# Patient Record
Sex: Male | Born: 1945 | Race: White | Hispanic: No | Marital: Married | Smoking: Former smoker
Health system: Southern US, Community
[De-identification: ages and names within clinical notes are randomized; demographics above are authoritative.]

## PROBLEM LIST (undated history)

## (undated) DIAGNOSIS — R0600 Dyspnea, unspecified: Secondary | ICD-10-CM

## (undated) DIAGNOSIS — Z89029 Acquired absence of unspecified finger(s): Secondary | ICD-10-CM

## (undated) DIAGNOSIS — J439 Emphysema, unspecified: Secondary | ICD-10-CM

## (undated) DIAGNOSIS — I714 Abdominal aortic aneurysm, without rupture, unspecified: Secondary | ICD-10-CM

## (undated) DIAGNOSIS — R0609 Other forms of dyspnea: Secondary | ICD-10-CM

## (undated) DIAGNOSIS — I10 Essential (primary) hypertension: Secondary | ICD-10-CM

## (undated) DIAGNOSIS — C61 Malignant neoplasm of prostate: Secondary | ICD-10-CM

## (undated) DIAGNOSIS — Z972 Presence of dental prosthetic device (complete) (partial): Secondary | ICD-10-CM

## (undated) DIAGNOSIS — Z973 Presence of spectacles and contact lenses: Secondary | ICD-10-CM

## (undated) DIAGNOSIS — M1990 Unspecified osteoarthritis, unspecified site: Secondary | ICD-10-CM

## (undated) HISTORY — PX: PROSTATE BIOPSY: SHX241

## (undated) HISTORY — PX: TONSILLECTOMY: SUR1361

## (undated) HISTORY — PX: APPENDECTOMY: SHX54

---

## 1990-01-14 HISTORY — PX: LAPAROSCOPIC CHOLECYSTECTOMY: SUR755

## 2005-04-08 ENCOUNTER — Encounter: Admission: RE | Admit: 2005-04-08 | Discharge: 2005-04-08 | Payer: Self-pay | Admitting: Occupational Medicine

## 2005-04-08 ENCOUNTER — Ambulatory Visit (HOSPITAL_COMMUNITY): Admission: RE | Admit: 2005-04-08 | Discharge: 2005-04-08 | Payer: Self-pay | Admitting: Occupational Medicine

## 2014-01-14 ENCOUNTER — Emergency Department (HOSPITAL_COMMUNITY)
Admission: EM | Admit: 2014-01-14 | Discharge: 2014-01-14 | Disposition: A | Discharge status: Home / Self Care | Payer: Medicare Other | Attending: Emergency Medicine | Admitting: Emergency Medicine

## 2014-01-14 ENCOUNTER — Encounter (HOSPITAL_COMMUNITY): Payer: Self-pay

## 2014-01-14 ENCOUNTER — Emergency Department (HOSPITAL_COMMUNITY): Payer: Medicare Other

## 2014-01-14 DIAGNOSIS — Z79899 Other long term (current) drug therapy: Secondary | ICD-10-CM | POA: Insufficient documentation

## 2014-01-14 DIAGNOSIS — Z7952 Long term (current) use of systemic steroids: Secondary | ICD-10-CM | POA: Diagnosis not present

## 2014-01-14 DIAGNOSIS — J387 Other diseases of larynx: Secondary | ICD-10-CM | POA: Insufficient documentation

## 2014-01-14 DIAGNOSIS — R221 Localized swelling, mass and lump, neck: Secondary | ICD-10-CM | POA: Insufficient documentation

## 2014-01-14 DIAGNOSIS — K088 Other specified disorders of teeth and supporting structures: Secondary | ICD-10-CM | POA: Diagnosis present

## 2014-01-14 DIAGNOSIS — J449 Chronic obstructive pulmonary disease, unspecified: Secondary | ICD-10-CM | POA: Insufficient documentation

## 2014-01-14 LAB — I-STAT CHEM 8, ED
BUN: 18 mg/dL (ref 6–23)
CHLORIDE: 102 meq/L (ref 96–112)
CREATININE: 0.9 mg/dL (ref 0.50–1.35)
Calcium, Ion: 1.18 mmol/L (ref 1.13–1.30)
GLUCOSE: 114 mg/dL — AB (ref 70–99)
HCT: 56 % — ABNORMAL HIGH (ref 39.0–52.0)
Hemoglobin: 19 g/dL — ABNORMAL HIGH (ref 13.0–17.0)
POTASSIUM: 4.6 mmol/L (ref 3.5–5.1)
Sodium: 139 mmol/L (ref 135–145)
TCO2: 25 mmol/L (ref 0–100)

## 2014-01-14 LAB — RAPID STREP SCREEN (MED CTR MEBANE ONLY): Streptococcus, Group A Screen (Direct): NEGATIVE

## 2014-01-14 MED ORDER — IOHEXOL 300 MG/ML  SOLN
80.0000 mL | Freq: Once | INTRAMUSCULAR | Status: AC | PRN
  Administered 2014-01-14: 80 mL via INTRAVENOUS

## 2014-01-14 MED ORDER — CLINDAMYCIN HCL 150 MG PO CAPS
300.0000 mg | ORAL_CAPSULE | Freq: Once | ORAL | Status: AC
  Administered 2014-01-14: 300 mg via ORAL
  Filled 2014-01-14: qty 2

## 2014-01-14 MED ORDER — HYDROCODONE-ACETAMINOPHEN 5-325 MG PO TABS: 1.0000 | ORAL_TABLET | ORAL | Status: DC | PRN

## 2014-01-14 MED ORDER — CLINDAMYCIN HCL 300 MG PO CAPS: 300.0000 mg | ORAL_CAPSULE | Freq: Four times a day (QID) | ORAL | Status: DC

## 2014-01-14 MED ORDER — ONDANSETRON HCL 4 MG/2ML IJ SOLN
4.0000 mg | Freq: Once | INTRAMUSCULAR | Status: AC
  Administered 2014-01-14: 4 mg via INTRAVENOUS
  Filled 2014-01-14: qty 2

## 2014-01-14 MED ORDER — KETOROLAC TROMETHAMINE 30 MG/ML IJ SOLN
30.0000 mg | Freq: Once | INTRAMUSCULAR | Status: AC
  Administered 2014-01-14: 30 mg via INTRAVENOUS
  Filled 2014-01-14: qty 1

## 2014-01-14 MED ORDER — MORPHINE SULFATE 4 MG/ML IJ SOLN
4.0000 mg | Freq: Once | INTRAMUSCULAR | Status: AC
  Administered 2014-01-14: 2 mg via INTRAVENOUS
  Filled 2014-01-14: qty 1

## 2014-01-14 NOTE — ED Notes (Signed)
Mark Cline, [place] has assessed pt; awaiting Dr. Vevelyn Francois assessment to determine if any imaging tests are necessary. Pt is aware of plan and verbalized understanding.

## 2014-01-14 NOTE — ED Provider Notes (Signed)
CSN: [id number]     Arrival date & time 01/14/14  1045 History   First MD Initiated Contact with Patient 01/14/14 1102     Chief Complaint  Patient presents with  . Dental Pain     (Consider location/radiation/quality/duration/timing/severity/associated sxs/prior Treatment) The history is provided by the patient.   Mark Cline is a 69 y.o. male with a history of COPD, HTN, continued smoker presenting with left sided jaw and throat pain which has been slowly progressive for the past 4 days.  He describes several pain with swallowing but is able to swallow, maintaining saliva and soft to liquid oral intake.  He denies fevers or chills and denies any dental problems although the pain does radiate to the left jawline.  He denies any worsened shortness of breath from his baseline, cough, wheezing, stridor or voice changes. He has had no recent uri sx including nasal congestion or rhinorrhea.   He has taken no medications prior to arrival today.     Past Medical History  Diagnosis Date  . COPD (chronic obstructive pulmonary disease)   . Emphysema lung    Past Surgical History  Procedure Laterality Date  . Cholecystectomy    . Appendectomy    . Finger amputated     No family history on file. History  Substance Use Topics  . Smoking status: Current Every Day Smoker  . Smokeless tobacco: Not on file  . Alcohol Use: No    Review of Systems  Constitutional: Negative for fever and chills.  HENT: Positive for sore throat. Negative for congestion, ear pain, rhinorrhea, sinus pressure, trouble swallowing and voice change.   Eyes: Negative for discharge.  Respiratory: Negative for cough, choking, shortness of breath, wheezing and stridor.   Cardiovascular: Negative for chest pain.  Gastrointestinal: Negative for abdominal pain.  Genitourinary: Negative.       Allergies  Review of patient's allergies indicates no known allergies.  Home Medications   Prior to Admission medications    Medication Sig Start Date End Date Taking? Authorizing Provider  Fluticasone-Salmeterol (ADVAIR) 250-50 MCG/DOSE AEPB Inhale 1 puff into the lungs 2 (two) times daily.   Yes Historical Provider, MD  lisinopril (PRINIVIL,ZESTRIL) 10 MG tablet Take 10 mg by mouth daily.   Yes Historical Provider, MD   BP 148/82 mmHg  Pulse 80  Temp(Src) 97.9 F (36.6 C) (Oral)  Resp 18  Ht 5\' 11"  (1.803 m)  Wt 200 lb (90.719 kg)  BMI 27.91 kg/m2  SpO2 99% Physical Exam  Constitutional: He is oriented to person, place, and time. He appears well-developed and well-nourished.  HENT:  Head: Normocephalic and atraumatic.  Right Ear: Tympanic membrane and ear canal normal.  Left Ear: Tympanic membrane and ear canal normal.  Nose: No mucosal edema or rhinorrhea.  Mouth/Throat: Uvula is midline and mucous membranes are normal. Posterior oropharyngeal erythema present. No oropharyngeal exudate, posterior oropharyngeal edema or tonsillar abscesses.  Trace erythema along soft palate edge only.  No edema, no exudate. Tonsils absent. Uvula midline, unable to visualize epiglottis.  Eyes: Conjunctivae are normal.  Neck: Neck supple. No tracheal tenderness present. No thyroid mass present.  Cardiovascular: Normal rate and normal heart sounds.   Pulmonary/Chest: Effort normal. No stridor. No respiratory distress. He has no decreased breath sounds. He has no wheezes. He has no rhonchi. He has no rales.  Abdominal: Soft. There is no tenderness.  Musculoskeletal: Normal range of motion.  Lymphadenopathy:       Head (left side): Tonsillar adenopathy  present.    He has no cervical adenopathy.  Mild left tonsillar enlargement compared to right, ttp.  Neurological: He is alert and oriented to person, place, and time.  Skin: Skin is warm and dry. No rash noted.  Psychiatric: He has a normal mood and affect.    ED Course  Procedures (including critical care time) Labs Review Labs Reviewed  I-STAT CHEM 8, ED -  Abnormal; Notable for the following:    Glucose, Bld 114 (*)    Hemoglobin 19.0 (*)    HCT 56.0 (*)    All other components within normal limits  RAPID STREP SCREEN  CULTURE, GROUP A STREP    Imaging Review Ct Soft Tissue Neck W Contrast  01/14/2014   CLINICAL DATA:  Painful palpable lump in the left side of the neck. Difficulty swallowing. Throat pain while eating. No recent injuries.  EXAM: CT NECK WITH CONTRAST  TECHNIQUE: Multidetector CT imaging of the neck was performed using the standard protocol following the bolus administration of intravenous contrast. The site of the palpable lump was marked with a metallic BB.  CONTRAST:  20mL OMNIPAQUE IOHEXOL 300 MG/ML IV.  COMPARISON:  None.  FINDINGS: In the area of palpable concern, there is no mass or pathologic lymphadenopathy. The inferior margin of the submandibular salivary gland on the left directly underlies the area of palpable concern.  Pharynx and larynx: 7 mm diameter cyst arising from the epiglottis with enhancing wall. Prominent though symmetric adenoidal tissue in both sides of the nasopharynx. No other mass involving the pharynx. Normal-appearing larynx.  Salivary glands: Normal.  Thyroid: Mildly atrophic, otherwise normal.  Lymph nodes: No pathologic cervical lymphadenopathy.  Vascular: Atherosclerosis involving the common carotid arteries, carotid bulbs and proximal internal carotid arteries bilaterally without significant stenosis.  Limited intracranial: Unremarkable.  Mastoids and visualized paranasal sinuses: Clear.  Skeleton: Moderate to severe space narrowing and endplate hypertrophic changes at C5-6 and C6-7, and to a lesser degree C3-4.  Upper chest: Severe emphysematous changes involving the visualized upper lobes.  IMPRESSION: 1. No mass or pathologic lymphadenopathy in the area of palpable concern in the left side of the neck. The inferior margin of the left submandibular salivary gland directly underlies this area. 2. 7 mm cyst  arising from the epiglottis. The fact that the cyst has an enhancing wall may indicate that it is an infected cyst and may be a cause for the patient's sore throat. 3. No masses elsewhere in the neck. No pathologic cervical lymphadenopathy. 4. Bilateral carotid atherosclerosis without evidence of significant stenosis. 5. Degenerative changes in the cervical spine is as detailed above. 6. Severe emphysematous changes in the visualized upper lobes.   Electronically Signed   By: Evangeline Dakin M.D.   On: 01/14/2014 13:56     EKG Interpretation None      MDM   Final diagnoses:  Neck swelling  Epiglottic cyst    Pt was seen by Dr Wyvonnia Dusky during this visit.  Ct soft tissue neck ordered.   Results per above.  Discussed with Dr Redmond Baseman who advised clindamycin and close f/u with him.  Will see in his office early this week. Pt given instructions for close f/u here or [place], calling Dr Redmond Baseman as well for worsened sx including fever, increased pain, swallowing or swelling.  Pt understands plan and need for recheck immediately for any worsened sx.  He was given first dose of clindamycin here.  Prescribed hydrocodone for pain relief, cautioned re sedation. Soft foods/fluids as  tolerated.    Evalee Jefferson, PA-C 01/14/14 1454  Ezequiel Essex, MD 01/14/14 [phone]

## 2014-01-14 NOTE — ED Notes (Signed)
Pt c/o toothache, left jaw pain, and sore throat on left side of neck since Tuesday.  Denies fever.

## 2014-01-14 NOTE — Discharge Instructions (Signed)
As discussed call Dr. Redmond Baseman or return here (or to [facility]) immediately for any worsening pain, swelling, shortness of breath or other new or worsened symptoms.  Take the antibiotics as prescribed, 2 more doses today.  You may take the hydrocodone prescribed for pain relief.  This will make you drowsy - do not drive within 4 hours of taking this medication.  Eat whatever is comfortable - you may want to maintain a soft food diet for the next several days.  Eating yogurt with active yeast cultures daily may help prevent side effects (diarrhea) from this antibiotic.

## 2014-01-16 LAB — CULTURE, GROUP A STREP

## 2017-02-07 ENCOUNTER — Ambulatory Visit: Payer: Medicare Other | Admitting: Urology

## 2017-02-07 ENCOUNTER — Other Ambulatory Visit (HOSPITAL_COMMUNITY)
Admission: AD | Admit: 2017-02-07 | Discharge: 2017-02-07 | Disposition: A | Discharge status: Home / Self Care | Payer: Medicare Other | Source: Other Acute Inpatient Hospital | Attending: Urology | Admitting: Urology

## 2017-02-07 ENCOUNTER — Other Ambulatory Visit: Payer: Self-pay | Admitting: Urology

## 2017-02-07 DIAGNOSIS — N402 Nodular prostate without lower urinary tract symptoms: Secondary | ICD-10-CM

## 2017-02-07 DIAGNOSIS — R3121 Asymptomatic microscopic hematuria: Secondary | ICD-10-CM | POA: Insufficient documentation

## 2017-02-07 DIAGNOSIS — R972 Elevated prostate specific antigen [PSA]: Secondary | ICD-10-CM

## 2017-02-07 LAB — URINALYSIS, COMPLETE (UACMP) WITH MICROSCOPIC
BACTERIA UA: NONE SEEN
BILIRUBIN URINE: NEGATIVE
Glucose, UA: NEGATIVE mg/dL
KETONES UR: NEGATIVE mg/dL
LEUKOCYTES UA: NEGATIVE
NITRITE: NEGATIVE
Protein, ur: NEGATIVE mg/dL
Specific Gravity, Urine: 1.016 (ref 1.005–1.030)
pH: 5 (ref 5.0–8.0)

## 2017-02-28 ENCOUNTER — Ambulatory Visit (HOSPITAL_COMMUNITY)
Admission: RE | Admit: 2017-02-28 | Discharge: 2017-02-28 | Disposition: A | Discharge status: Home / Self Care | Payer: Medicare Other | Source: Ambulatory Visit | Attending: Urology | Admitting: Urology

## 2017-02-28 ENCOUNTER — Encounter (HOSPITAL_COMMUNITY): Payer: Self-pay

## 2017-02-28 DIAGNOSIS — C61 Malignant neoplasm of prostate: Secondary | ICD-10-CM | POA: Insufficient documentation

## 2017-02-28 DIAGNOSIS — R972 Elevated prostate specific antigen [PSA]: Secondary | ICD-10-CM

## 2017-02-28 DIAGNOSIS — N402 Nodular prostate without lower urinary tract symptoms: Secondary | ICD-10-CM | POA: Diagnosis not present

## 2017-02-28 MED ORDER — LIDOCAINE HCL (PF) 2 % IJ SOLN
INTRAMUSCULAR | Status: AC
  Administered 2017-02-28: 10 mL
  Filled 2017-02-28: qty 10

## 2017-02-28 MED ORDER — CEFTRIAXONE SODIUM 1 G IJ SOLR
1.0000 g | Freq: Once | INTRAMUSCULAR | Status: AC
  Administered 2017-02-28: 1 g via INTRAMUSCULAR

## 2017-02-28 MED ORDER — CEFTRIAXONE SODIUM 1 G IJ SOLR
INTRAMUSCULAR | Status: AC
  Administered 2017-02-28: 1 g via INTRAMUSCULAR
  Filled 2017-02-28: qty 10

## 2017-02-28 MED ORDER — LIDOCAINE HCL (PF) 1 % IJ SOLN
INTRAMUSCULAR | Status: AC
  Administered 2017-02-28: 2.1 mL
  Filled 2017-02-28: qty 5

## 2017-02-28 NOTE — Discharge Instructions (Signed)
Transrectal Ultrasound-Guided Biopsy A transrectal ultrasound-guided biopsy is a procedure to remove samples of tissue from your prostate using ultrasound images to guide the procedure. The procedure is usually done to evaluate the prostate gland of men who have an elevated prostate-specific antigen (PSA). PSA is a blood test to screen for prostate cancer. The biopsy samples are taken to check for prostate cancer. Tell a health care provider about:  Any allergies you have.  All medicines you are taking, including vitamins, herbs, eye drops, creams, and over-the-counter medicines.  Any problems you or family members have had with anesthetic medicines.  Any blood disorders you have.  Any surgeries you have had.  Any medical conditions you have. What are the risks? Generally, this is a safe procedure. However, as with any procedure, problems can occur. Possible problems include:  Infection of your prostate.  Bleeding from your rectum or blood in your urine.  Difficulty urinating.  Nerve damage (this is usually temporary).  Damage to surrounding structures such as blood vessels, organs, and muscles, which would require other procedures.  What happens before the procedure?  Do not eat or drink anything after midnight on the night before the procedure or as directed by your health care provider.  Take medicines only as directed by your health care provider.  Your health care provider may have you stop taking certain medicines 5-7 days before the procedure.  You will be given an enema before the procedure. During an enema, a liquid is injected into your rectum to clear out waste.  You may have lab tests the day of your procedure.  Plan to have someone take you home after the procedure. What happens during the procedure?  You will be given medicine to help you relax (sedative) before the procedure. An IV tube will be inserted into one of your veins and used to give fluids and  medicine.  You will be given antibiotic medicine to reduce the risk of an infection.  You will be placed on your side for the procedure.  A probe with lubricated gel will be placed into your rectum, and images will be taken of your prostate and surrounding structures.  Numbing medicine will be injected into the prostate before the biopsy samples are taken.  A biopsy needle will then be inserted and guided to your prostate with the use of the ultrasound images.  Samples of prostate tissue will be taken, and the needle will then be removed.  The biopsy samples will be sent to a lab to be analyzed. Results are usually back in 2-3 days. What happens after the procedure?  You will be taken to a recovery area where you will be monitored.  You may have some discomfort in the rectal area. You will be given pain medicines to control this.  You may be allowed to go home the same day, or you may need to stay in the hospital overnight. This information is not intended to replace advice given to you by your health care provider. Make sure you discuss any questions you have with your health care provider. Document Released: 05/17/2013 Document Revised: 06/08/2015 Document Reviewed: 08/19/2012 Elsevier Interactive Patient Education  [employer].

## 2017-03-13 ENCOUNTER — Other Ambulatory Visit: Payer: Self-pay | Admitting: Urology

## 2017-03-13 DIAGNOSIS — C61 Malignant neoplasm of prostate: Secondary | ICD-10-CM

## 2017-03-17 ENCOUNTER — Ambulatory Visit (HOSPITAL_COMMUNITY)
Admission: RE | Admit: 2017-03-17 | Discharge: 2017-03-17 | Disposition: A | Discharge status: Home / Self Care | Payer: Medicare Other | Source: Ambulatory Visit | Attending: Urology | Admitting: Urology

## 2017-03-17 ENCOUNTER — Ambulatory Visit (HOSPITAL_COMMUNITY): Payer: Medicare Other

## 2017-03-17 ENCOUNTER — Ambulatory Visit (HOSPITAL_COMMUNITY): Admission: RE | Admit: 2017-03-17 | Payer: Medicare Other | Source: Ambulatory Visit

## 2017-03-17 ENCOUNTER — Encounter (HOSPITAL_COMMUNITY): Payer: Self-pay

## 2017-03-17 DIAGNOSIS — C61 Malignant neoplasm of prostate: Secondary | ICD-10-CM | POA: Diagnosis not present

## 2017-03-17 DIAGNOSIS — I714 Abdominal aortic aneurysm, without rupture: Secondary | ICD-10-CM | POA: Insufficient documentation

## 2017-03-17 DIAGNOSIS — K5730 Diverticulosis of large intestine without perforation or abscess without bleeding: Secondary | ICD-10-CM | POA: Diagnosis not present

## 2017-03-17 LAB — POCT I-STAT CREATININE: Creatinine, Ser: 1.2 mg/dL (ref 0.61–1.24)

## 2017-03-17 MED ORDER — IOPAMIDOL (ISOVUE-300) INJECTION 61%
100.0000 mL | Freq: Once | INTRAVENOUS | Status: AC | PRN
  Administered 2017-03-17: 100 mL via INTRAVENOUS

## 2017-03-18 ENCOUNTER — Encounter (HOSPITAL_COMMUNITY)
Admission: RE | Admit: 2017-03-18 | Discharge: 2017-03-18 | Disposition: A | Discharge status: Home / Self Care | Payer: Medicare Other | Source: Ambulatory Visit | Attending: Urology | Admitting: Urology

## 2017-03-18 ENCOUNTER — Encounter (HOSPITAL_COMMUNITY): Payer: Self-pay

## 2017-03-18 DIAGNOSIS — C61 Malignant neoplasm of prostate: Secondary | ICD-10-CM | POA: Diagnosis not present

## 2017-03-18 MED ORDER — TECHNETIUM TC 99M MEDRONATE IV KIT
25.0000 | PACK | Freq: Once | INTRAVENOUS | Status: AC | PRN
  Administered 2017-03-18: 20.5 via INTRAVENOUS

## 2017-03-31 ENCOUNTER — Telehealth: Payer: Self-pay | Admitting: Medical Oncology

## 2017-03-31 NOTE — Telephone Encounter (Signed)
I called pt to introduce myself as the Prostate Nurse Navigator and the Coordinator of the Prostate [place].  1. I confirmed with the patient he is aware of his referral to the clinic 04/01/17 arriving at 12:30pm.   2. I discussed the format of the clinic and the physicians he will be seeing that day.  3. I discussed where the clinic is located and how to contact me.  4. I confirmed his address and informed him I would be mailing a packet of information and forms to be completed. I asked him to bring them with him the day of his appointment.   He voiced understanding of the above. I asked him to call me if he has any questions or concerns regarding his appointments or the forms he needs to complete.

## 2017-04-01 ENCOUNTER — Encounter: Payer: Self-pay | Admitting: Medical Oncology

## 2017-04-03 ENCOUNTER — Encounter: Payer: Self-pay | Admitting: Radiation Oncology

## 2017-04-03 NOTE — Progress Notes (Signed)
GU Location of Tumor / Histology: prostatic adenocarcinoma  If Prostate Cancer, Gleason Score is (4 + 5) and PSA is (8.8). Prostate volume: 35 grams.  Mark Cline was referred by Dr. Monico Blitz, MD to Dr. Jeffie Pollock for further evaluation of an elevated PSA in January 2019  Biopsies of prostate (if applicable) revealed:    Past/Anticipated interventions by urology, if any: prostate biopsy, referral to [facility]  Past/Anticipated interventions by medical oncology, if any: no  Weight changes, if any: no  Bowel/Bladder complaints, if any: Reports leakage of urine, hard to postpone urination, and erection dysfunction. History of microhematuria.  Nausea/Vomiting, if any: no  Pain issues, if any:  no  SAFETY ISSUES:  Prior radiation? no  Pacemaker/ICD? no  Possible current pregnancy? no  Is the patient on methotrexate? no  Current Complaints / other details:  72 year old male. Married. NKDA. Smokes occasionally. Reports SOB.

## 2017-04-04 ENCOUNTER — Telehealth: Payer: Self-pay | Admitting: Medical Oncology

## 2017-04-04 NOTE — Telephone Encounter (Signed)
Patient called to clarify day of clinic. He has gotten confused on the date. [place] 04/08/17 arriving at 12:30pm. He voiced understanding.

## 2017-04-07 ENCOUNTER — Telehealth: Payer: Self-pay | Admitting: Medical Oncology

## 2017-04-07 NOTE — Telephone Encounter (Signed)
Confirmed appointment for [facility] 04/08/17 arriving at 12:30pm. I reviewed [place] parking, registration and reminded him to bring his completed medical forms. I also asked him to have lunch prior to arrival due to the length of the clinic.

## 2017-04-08 ENCOUNTER — Ambulatory Visit
Admission: RE | Admit: 2017-04-08 | Discharge: 2017-04-08 | Disposition: A | Discharge status: Home / Self Care | Payer: Medicare Other | Source: Ambulatory Visit | Attending: Radiation Oncology | Admitting: Radiation Oncology

## 2017-04-08 ENCOUNTER — Other Ambulatory Visit: Payer: Self-pay

## 2017-04-08 ENCOUNTER — Inpatient Hospital Stay: Payer: Medicare Other | Attending: Oncology | Admitting: Oncology

## 2017-04-08 ENCOUNTER — Encounter: Payer: Self-pay | Admitting: General Practice

## 2017-04-08 ENCOUNTER — Encounter: Payer: Self-pay | Admitting: Medical Oncology

## 2017-04-08 ENCOUNTER — Encounter: Payer: Self-pay | Admitting: Radiation Oncology

## 2017-04-08 DIAGNOSIS — Z8546 Personal history of malignant neoplasm of prostate: Secondary | ICD-10-CM | POA: Diagnosis not present

## 2017-04-08 DIAGNOSIS — E291 Testicular hypofunction: Secondary | ICD-10-CM

## 2017-04-08 DIAGNOSIS — C61 Malignant neoplasm of prostate: Secondary | ICD-10-CM

## 2017-04-08 DIAGNOSIS — J449 Chronic obstructive pulmonary disease, unspecified: Secondary | ICD-10-CM | POA: Diagnosis not present

## 2017-04-08 DIAGNOSIS — Z79899 Other long term (current) drug therapy: Secondary | ICD-10-CM | POA: Insufficient documentation

## 2017-04-08 DIAGNOSIS — Z87891 Personal history of nicotine dependence: Secondary | ICD-10-CM | POA: Insufficient documentation

## 2017-04-08 HISTORY — DX: Malignant neoplasm of prostate: C61

## 2017-04-08 NOTE — Progress Notes (Signed)
                               Care Plan Summary  Name: Mark Cline DOB: July 26, 1945  Your Medical Team:   Urologist -  Dr. Raynelle Bring, [facility]  Radiation Oncologist - Dr. Tyler Pita, [facility]   Medical Oncologist - Dr. Zola Button, [place]  Recommendations: 1) Androgen deprivation (hormone injection) with                 radiation     2) Radiation with seed boost  3) Surgery  * These recommendations are based on information available as of today's consult.      Recommendations may change depending on the results of further tests or exams.  Next Steps: 1) Dr. Ralene Muskrat office will schedule hormone injectio  2) Enid Derry with Dr. Johny Shears office will contact you with      appointments for seed boost and radiation.   When appointments need to be scheduled, you will be contacted by [facility] and/or [facility].  Patient provided with business cards for all team members. A copy of "Fall Prevention Patient Safety Plan" given to patient.  Questions?  Please do not hesitate to call Cira Rue, RN, BSN, OCN at [phone] any questions or concerns.  Shirlean Mylar is your Oncology Nurse Navigator and is available to assist you while you're receiving your medical care at [facility].

## 2017-04-08 NOTE — Progress Notes (Signed)
Reason for Referral: Prostate cancer  HPI: 72 year old gentleman currently of [place],  [place] where he lived the majority of his life.  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  He was noted to have an elevated PSA in January 2019 on a routine physical examination.  At that time his PSA was 8.8 by his primary care physician.  He was subsequently referred to Dr. Jeffie Pollock and underwent a prostate biopsy that was completed in February 2019.  His biopsy showed prostate cancer in 7 cores with 3 cores of 4+5 = 9, one core of 4+4 = 8 and 3 cores of Gleason 6.  Staging workup showed no evidence of metastatic disease in the abdomen and pelvis by CT scan criteria.  His bone scan showed no bony disease.  He is completely asymptomatic at this time.  He does report occasional dyspnea on exertion related to his emphysema.   He does not report any headaches, blurry vision, syncope or seizures. Does not report any fevers, chills or sweats.  Does not report any cough, wheezing or hemoptysis.  Does not report any chest pain, palpitation, orthopnea or leg edema.  Does not report any nausea, vomiting or abdominal pain.  Does not report any constipation or diarrhea.  Does not report any skeletal complaints.    Does not report frequency, urgency or hematuria.  Does not report any skin rashes or lesions. Does not report any heat or cold intolerance.  Does not report any lymphadenopathy or petechiae.  Does not report any anxiety or depression.  Remaining review of systems is negative.    Past Medical History:  Diagnosis Date  . COPD (chronic obstructive pulmonary disease) ([place])   . Emphysema lung ([place])   . Prostate cancer [facility])   :  Past Surgical History:  Procedure Laterality Date  . APPENDECTOMY    . CHOLECYSTECTOMY    . finger amputated    . PROSTATE BIOPSY    :   Current Outpatient  Medications:  .  Fluticasone-Salmeterol (ADVAIR) 250-50 MCG/DOSE AEPB, Inhale 1 puff into the lungs 2 (two) times daily., Disp: , Rfl:  .  lisinopril (PRINIVIL,ZESTRIL) 10 MG tablet, Take 10 mg by mouth daily., Disp: , Rfl: :  No Known Allergies:  No family history on file.:  Social History   Socioeconomic History  . Marital status: Married    Spouse name: Not on file  . Number of children: Not on file  . Years of education: Not on file  . Highest education level: Not on file  Occupational History  . Not on file  Social Needs  . Financial resource strain: Not on file  . Food insecurity:    Worry: Not on file    Inability: Not on file  . Transportation needs:    Medical: Not on file    Non-medical: Not on file  Tobacco Use  . Smoking status: Current Every Day Smoker  . Smokeless tobacco: Never Used  Substance and Sexual Activity  . Alcohol use: No  . Drug use: No  . Sexual activity: Not on file  Lifestyle  . Physical activity:    Days per week: Not on file    Minutes per session: Not on file  . Stress: Not on file  Relationships  . Social connections:    Talks on phone: Not on file    Gets together:  Not on file    Attends religious service: Not on file    Active member of club or organization: Not on file    Attends meetings of clubs or organizations: Not on file    Relationship status: Not on file  . Intimate partner violence:    Fear of current or ex partner: Not on file    Emotionally abused: Not on file    Physically abused: Not on file    Forced sexual activity: Not on file  Other Topics Concern  . Not on file  Social History Narrative  . Not on file  :  Pertinent items are noted in HPI.  Exam: ECOG 0 General appearance: alert and cooperative appeared without distress. Head: atraumatic without any abnormalities. Eyes: conjunctivae/corneas clear. PERRL.  Sclera anicteric. Throat: lips, mucosa, and tongue normal; without oral thrush or ulcers. Resp:  clear to auscultation bilaterally without rhonchi, wheezes or dullness to percussion. Cardio: regular rate and rhythm, S1, S2 normal, no murmur, click, rub or gallop GI: soft, non-tender; bowel sounds normal; no masses,  no organomegaly Skin: Skin color, texture, turgor normal. No rashes or lesions Lymph nodes: Cervical, supraclavicular, and axillary nodes normal. Neurologic: Grossly normal without any motor, sensory or deep tendon reflexes. Musculoskeletal: No joint deformity or effusion.  CBC    Component Value Date/Time   HGB 19.0 (H) 01/14/2014 1234   HCT 56.0 (H) 01/14/2014 1234     Chemistry      Component Value Date/Time   NA 139 01/14/2014 1234   K 4.6 01/14/2014 1234   CL 102 01/14/2014 1234   BUN 18 01/14/2014 1234   CREATININE 1.20 03/17/2017 0953   No results found for: CALCIUM, ALKPHOS, AST, ALT, BILITOT     Ct Pelvis W Contrast  Result Date: 03/17/2017 CLINICAL DATA:  Newly diagnosed prostate carcinoma. EXAM: CT PELVIS WITH CONTRAST TECHNIQUE: Multidetector CT imaging of the pelvis was performed using the standard protocol following the bolus administration of intravenous contrast. CONTRAST:  17mL ISOVUE-300 IOPAMIDOL (ISOVUE-300) INJECTION 61% COMPARISON:  None. FINDINGS: Urinary Tract: Unremarkable urinary bladder. Bowel: Diverticulosis of descending and sigmoid colon seen, without evidence of diverticulitis in these regions. Vascular/Lymphatic: No evidence of pelvic lymphadenopathy. Infrarenal abdominal aortic aneurysm is seen measuring 4.4 cm. Reproductive: Prostate gland is within normal limits in size. Areas of peripheral zone hyperenhancement are seen bilaterally, which may be due to prostate carcinoma and/or prostatitis. Symmetric seminal vesicles. Other: None. Musculoskeletal: No significant abnormality identified. IMPRESSION: Bilateral prostate peripheral zone hyperenhancement, which may be due to prostate carcinoma and/or prostatitis. No evidence of pelvic  metastatic disease. Colonic diverticulosis, without radiographic evidence of diverticulitis. 4.4 cm infrarenal abdominal aortic aneurysm. Recommend followup by ultrasound in 1 year. This recommendation follows ACR consensus guidelines: White Paper of the ACR Incidental Findings Committee II on Vascular Findings. J Am Coll Radiol 2013; 10:789-794. Electronically Signed   By: Earle Gell M.D.   On: 03/17/2017 10:17   Nm Bone Scan Whole Body  Result Date: 03/18/2017 CLINICAL DATA:  72 year old male diagnosed with prostate cancer last month. Staging. Denies bone pain. EXAM: NUCLEAR MEDICINE WHOLE BODY BONE SCAN TECHNIQUE: Whole body anterior and posterior images were obtained approximately 3 hours after intravenous injection of radiopharmaceutical. RADIOPHARMACEUTICALS:  20.5 mCi Technetium-71m MDP IV COMPARISON:  Pelvis CT 03/17/2017 FINDINGS: Expected radiotracer activity in both kidneys in the urinary bladder. Homogeneous radiotracer activity throughout the axial skeleton including the skull. Degenerative appearing radiotracer activity about both thumbs, knees, and in the right foot.  No suspicious radiotracer activity identified in the appendicular skeleton. IMPRESSION: No findings specific for metastatic disease to bone. Electronically Signed   By: Genevie Ann M.D.   On: 03/18/2017 16:13    Assessment and Plan:   72 year old gentleman with prostate cancer diagnosed in January 2019.  His Gleason score 4+5 = 9 and a PSA of 8.8.  He had at least 7 cores involved with cancer with tertiary pattern Gleason score 8 and 6.  He has no evidence of metastatic disease documented on his staging workup.  His case was discussed today in the prostate cancer multidisciplinary clinic including review of his pathology and imaging studies with discussion with radiology.  The natural course of this disease was reviewed today with the patient and his family.  Treatment options include primary surgical therapy versus radiation  therapy with long-term androgen deprivation.  Given his high risk disease the duration of androgen deprivation will be at least 18 months.  Long-term complications associated with both therapies were reviewed especially with androgen deprivation.  These complications include hot flashes, weight gain among others.  He is on the upper limit for age to undergo primary surgical therapy is likely higher risk given his COPD.  He will evaluate these options carefully and make a decision in the near future.  Would likely favor ADT plus radiation as a definitive modality.  All his questions answered to his satisfaction.  30  minutes was spent with the patient face-to-face today.  More than 50% of time was dedicated to patient counseling, education and coordination of his care.

## 2017-04-08 NOTE — Consult Note (Signed)
@media  print    [facility] 04/08/2017    Mark Cline         MRN: [id number]  PRIMARY CARE:    DOB: [birth year], 72 year old Male  REFERRING:  Ashish C. Manuella Ghazi, MD  SSN:   PROVIDER:  Irine Seal, M.D.    TREATING:  Raynelle Bring, M.D.    LOCATION:  [facility] [phone]    CC/HPI: CC: Prostate Cancer   Physician requesting consult: Dr. Irine Seal  PCP: Dr. Monico Blitz  Location of consult: [facility] - Prostate Cancer Multidisciplinary Clinic   Mr. Woollard is a 72 year old gentleman who was found to have a persistent elevation of his PSA of 8.8 and right mid prostate nodule. He underwent a TRUS biopsy of the prostate on 02/28/17 that demonstrated Gleason 4+5=9 adenocarcinoma of the prostate with 7 out of 12 biopsy cores positive for malignancy.   Family history: None.   Imaging studies:  CT pelvis (03/17/17): No lymphadenopathy or other evidence of metastatic disease.  Bone scan (03/18/17): Negative.   PMH: He has a history of hypertension, COPD, arthritis, and AAA (4.4 cm).  PSH: Laparoscopic appendectomy and cholecystectomy.   TNM stage: cT2 N0 M0 (R mid)  PSA: 8.8  Gleason score: 4+5=9  Biopsy (02/28/17): 8/13 cores positive  Left: L apex (5%, 3+3=6), L mid (50%, 3+3=6)  Right: R apex (60%, 3+3=6), R mid (20%, 4+5=9), R lateral mid (80%, 4+5=9, 40% 4+5=9), R lateral base (10%, 4+4=8)  Prostate volume: 35.0 cc   Nomogram  OC disease: 18%  EPE: 80%  SVI: 29%  LNI: 31%  PFS (5 year, 10 year): 32%, 20%   Urinary function: IPSS is 2.  Erectile function: SHIM score is 1.     ALLERGIES: None   MEDICATIONS: Lisinopril  Advair Hfa     GU PSH: Prostate Needle Biopsy - 02/28/2017    NON-GU PSH: Cholecystectomy (open) Surgical Pathology, Gross And Microscopic Examination For Prostate Needle - 02/28/2017        GU PMH: Elevated PSA - 02/28/2017 Prostate nodule w/o LUTS - 02/28/2017    NON-GU PMH:  Arthritis COPD Hypertension    FAMILY HISTORY: None   SOCIAL HISTORY: Marital Status: Married Current Smoking Status: Patient smokes occasionally. Has smoked since 01/18/2017.  <DIV'  Tobacco Use Assessment Completed:  Used Tobacco in last 30 days?   Has never drank.  Drinks 1 caffeinated drink per day.    REVIEW OF SYSTEMS:    GU Review Male:  Patient denies frequent urination, hard to postpone urination, burning/ pain with urination, get up at night to urinate, leakage of urine, stream starts and stops, trouble starting your streams, and have to strain to urinate .   Gastrointestinal (Upper):  Patient denies nausea and vomiting.   Gastrointestinal (Lower):  Patient denies diarrhea and constipation.   Constitutional:  Patient denies fever, night sweats, weight loss, and fatigue.   Skin:  Patient denies skin rash/ lesion and itching.   Eyes:  Patient denies blurred vision and double vision.   Ears/ Nose/ Throat:  Patient denies sore throat and sinus problems.   Hematologic/Lymphatic:  Patient denies swollen glands and easy bruising.   Cardiovascular:  Patient denies leg swelling and chest pains.   Respiratory:  Patient denies cough and shortness of breath.   Endocrine:  Patient denies excessive thirst.   Musculoskeletal:  Patient denies back pain and joint pain.   Neurological:  Patient denies headaches  and dizziness.   Psychologic:  Patient denies anxiety and depression.   VITAL SIGNS: None   MULTI-SYSTEM PHYSICAL EXAMINATION:    Constitutional: Well-nourished. No physical deformities. Normally developed. Good grooming.        PAST DATA REVIEWED:   Source Of History:  Patient  Lab Test Review:  PSA  Records Review:  Pathology Reports, Previous Patient Records  X-Ray Review: C.T. Abdomen/Pelvis: Reviewed Films.  Bone Scan: Reviewed Films.      02/07/17  PSA  Total PSA 8.8 ng/dl    PROCEDURES: None   ASSESSMENT:     ICD-10 Details  1 GU:  Prostate Cancer - C61     PLAN:   Document  Letter(s):  Created for Patient: Clinical Summary   Created for Patient: Clinical Summary   Notes:  1. High-risk prostate cancer: I had a detailed discussion with Mr. Montz, his wife, and his daughter today. The patient was counseled about the natural history of prostate cancer and the standard treatment options that are available for prostate cancer. It was explained to him how his age and life expectancy, clinical stage, Gleason score, and PSA affect his prognosis, the decision to proceed with additional staging studies, as well as how that information influences recommended treatment strategies. We discussed the roles for active surveillance, radiation therapy, surgical therapy, androgen deprivation, as well as ablative therapy options for the treatment of prostate cancer as appropriate to his individual cancer situation. We discussed the risks and benefits of these options with regard to their impact on cancer control and also in terms of potential adverse events, complications, and impact on quality of life particularly related to urinary and sexual function. The patient was encouraged to ask questions throughout the discussion today and all questions were answered to his stated satisfaction. In addition, the patient was provided with and/or directed to appropriate resources and literature for further education about prostate cancer and treatment options.   I recommended therapy of curative intent and after reviewing his options, he feel most comfortable proceeding with long-term androgen deprivation and radiation therapy. He appropriately had concerns about increased risk of incontinence considering his advanced age if he underwent surgery. He also has concerns about his COPD and a general anesthetic. I will notify Dr. Jeffie Pollock of his decision so he may begin androgen deprivation. He will be scheduled for planning for radiation per Dr. Tammi Klippel.   Cc: Dr. Monico Blitz  Dr. Irine Seal  Dr. Ledon Snare  Dr. Zola Button

## 2017-04-08 NOTE — Progress Notes (Signed)
Radiation Oncology         [phone] ________________________________  Multidisciplinary Prostate Cancer Clinic  Initial Radiation Oncology Consultation  Name: Mark Cline MRN: [id number]  Date: 04/08/2017  DOB: [birth year]  QT:[id number], Mark Cline Internal  Mark Bring, MD   REFERRING PHYSICIAN: Raynelle Bring, MD  DIAGNOSIS: 72 y.o. gentleman with stage T2b adenocarcinoma of the prostate with a Gleason's score of 4+5 and a PSA of 8.8    ICD-10-CM   1. Malignant neoplasm of prostate ([place]) C61     HISTORY OF PRESENT ILLNESS::Mark Cline is a 72 y.o. gentleman.  He was noted to have an elevated PSA of 6.7 in 09/2016 by his primary care physician, Dr. Manuella Cline. He was treated with a course of antibiotics and repeat PSA in 11/2016 was further elevated at 8.0. Accordingly, he was referred for evaluation in urology by Dr. Jeffie Cline on February 07, 2017,  digital rectal examination was performed at that time revealing a 20 mm right mid-gland prostate nodule.  A repeat PSA on 02/07/17 was 8.8.  The patient proceeded to transrectal ultrasound with 12 biopsies of the prostate on 02/28/17.  The prostate volume measured 35 cc.  Out of 12 core biopsies,7 were positive.  The maximum Gleason score was 4+5, and this was seen in right mid, right base, and right mid lateral.  Additionally, there was Gleason 4+4 disease in the right base lateral and Gleason 3+3 disease in the right apex, left apex and left mid gland.  The patient underwent whole body bone scan and CT Pelvis for disease staging.  CT Pelvis on 03/17/17 was without evidence of pelvic lymphadenopathy or masses and bone scan on 03/18/17 was negative for osseous metastatic disease.  The patient reviewed the biopsy results with his urologist and he has kindly been referred today to the multidisciplinary prostate cancer clinic for presentation of pathology and radiology studies in our conference for discussion of potential radiation treatment options and  clinical evaluation.  His wife and daughter are present with him for today's visit.  PREVIOUS RADIATION THERAPY: No  PAST MEDICAL HISTORY:  has a past medical history of COPD (chronic obstructive pulmonary disease) ([place]), Emphysema lung ([place]), and Prostate cancer ([place]).    PAST SURGICAL HISTORY: Past Surgical History:  Procedure Laterality Date  . APPENDECTOMY    . CHOLECYSTECTOMY    . finger amputated    . PROSTATE BIOPSY      FAMILY HISTORY: family history includes Cancer (age of onset: 18) in his father.  SOCIAL HISTORY:  reports that he quit smoking about 8 months ago. His smoking use included cigarettes. He has a 58.00 pack-year smoking history. He has never used smokeless tobacco. He reports that he does not drink alcohol or use drugs.  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  He quit smoking approximately 9 months ago.   ALLERGIES: Patient has no known allergies.  MEDICATIONS:  Current Outpatient Medications  Medication Sig Dispense Refill  . Fluticasone-Salmeterol (ADVAIR) 250-50 MCG/DOSE AEPB Inhale 1 puff into the lungs 2 (two) times daily.    Marland Kitchen lisinopril (PRINIVIL,ZESTRIL) 10 MG tablet Take 10 mg by mouth daily.    . TRELEGY ELLIPTA 100-62.5-25 MCG/INH AEPB      No current facility-administered medications for this encounter.     REVIEW OF SYSTEMS:  On review of systems, the patient reports that he is doing well overall. He is positive for glasses, runny nose, dentures, shortness of breath with walking or stairs, productive cough  with whitish sputum, bruises easily, joint pain, and arthritis. He denies any chest pain, fevers, chills, night sweats, unintended weight changes. He denies any bowel disturbances, and denies abdominal pain, nausea or vomiting. He denies any new musculoskeletal or joint aches or pains. His IPSS was 2, indicating mild urinary symptoms. He is not to complete sexual activity with most attempts. A complete review  of systems is obtained and is otherwise negative.   PHYSICAL EXAM:  Wt Readings from Last 3 Encounters:  04/08/17 185 lb (83.9 kg)  01/14/14 200 lb (90.7 kg)   Temp Readings from Last 3 Encounters:  04/08/17 98.1 F (36.7 C) (Oral)  02/28/17 98 F (36.7 C) (Oral)  01/14/14 97.9 F (36.6 C) (Oral)   BP Readings from Last 3 Encounters:  04/08/17 109/69  02/28/17 109/67  01/14/14 148/82   Pulse Readings from Last 3 Encounters:  04/08/17 87  02/28/17 73  01/14/14 80   Pain Assessment Pain Score: 0-No pain/10  In general this is a well appearing Caucasian male in no acute distress.  He is alert and oriented x4 and appropriate throughout the examination. HEENT reveals that the patient is normocephalic, atraumatic. EOMs are intact. PERRLA. Skin is intact without any evidence of gross lesions. Cardiovascular exam reveals a regular rate and rhythm, no clicks rubs or murmurs are auscultated. Chest reveals expiratory wheezing bilaterally but is otherwise clear to auscultation. Lymphatic assessment is performed and does not reveal any adenopathy in the cervical, supraclavicular, axillary, or inguinal chains. Abdomen has active bowel sounds in all quadrants and is intact. The abdomen is soft, non tender, non distended. Lower extremities are negative for pretibial pitting edema, deep calf tenderness, cyanosis or clubbing.  KPS = 90  100 - Normal; no complaints; no evidence of disease. 90   - Able to carry on normal activity; minor signs or symptoms of disease. 80   - Normal activity with effort; some signs or symptoms of disease. 10   - Cares for self; unable to carry on normal activity or to do active work. 60   - Requires occasional assistance, but is able to care for most of his personal needs. 50   - Requires considerable assistance and frequent medical care. 7   - Disabled; requires special care and assistance. 76   - Severely disabled; hospital admission is indicated although death  not imminent. 33   - Very sick; hospital admission necessary; active supportive treatment necessary. 10   - Moribund; fatal processes progressing rapidly. 0     - Dead  Karnofsky DA, Abelmann [place], Craver LS and Burchenal [facility] [phone]) The use of the nitrogen mustards in the palliative treatment of carcinoma: with particular reference to bronchogenic carcinoma Cancer 1 [phone]   LABORATORY DATA:  Lab Results  Component Value Date   HGB 19.0 (H) 01/14/2014   HCT 56.0 (H) 01/14/2014   Lab Results  Component Value Date   NA 139 01/14/2014   K 4.6 01/14/2014   CL 102 01/14/2014   No results found for: ALT, AST, GGT, ALKPHOS, BILITOT   RADIOGRAPHY: Ct Pelvis W Contrast  Result Date: 03/17/2017 CLINICAL DATA:  Newly diagnosed prostate carcinoma. EXAM: CT PELVIS WITH CONTRAST TECHNIQUE: Multidetector CT imaging of the pelvis was performed using the standard protocol following the bolus administration of intravenous contrast. CONTRAST:  174mL ISOVUE-300 IOPAMIDOL (ISOVUE-300) INJECTION 61% COMPARISON:  None. FINDINGS: Urinary Tract: Unremarkable urinary bladder. Bowel: Diverticulosis of descending and sigmoid colon seen, without evidence of diverticulitis in these regions.  Vascular/Lymphatic: No evidence of pelvic lymphadenopathy. Infrarenal abdominal aortic aneurysm is seen measuring 4.4 cm. Reproductive: Prostate gland is within normal limits in size. Areas of peripheral zone hyperenhancement are seen bilaterally, which may be due to prostate carcinoma and/or prostatitis. Symmetric seminal vesicles. Other: None. Musculoskeletal: No significant abnormality identified. IMPRESSION: Bilateral prostate peripheral zone hyperenhancement, which may be due to prostate carcinoma and/or prostatitis. No evidence of pelvic metastatic disease. Colonic diverticulosis, without radiographic evidence of diverticulitis. 4.4 cm infrarenal abdominal aortic aneurysm. Recommend followup by ultrasound in 1 year. This  recommendation follows ACR consensus guidelines: White Paper of the ACR Incidental Findings Committee II on Vascular Findings. J Am Coll Radiol 2013; 10:789-794. Electronically Signed   By: Earle Gell M.D.   On: 03/17/2017 10:17   Nm Bone Scan Whole Body  Result Date: 03/18/2017 CLINICAL DATA:  72 year old male diagnosed with prostate cancer last month. Staging. Denies bone pain. EXAM: NUCLEAR MEDICINE WHOLE BODY BONE SCAN TECHNIQUE: Whole body anterior and posterior images were obtained approximately 3 hours after intravenous injection of radiopharmaceutical. RADIOPHARMACEUTICALS:  20.5 mCi Technetium-78m MDP IV COMPARISON:  Pelvis CT 03/17/2017 FINDINGS: Expected radiotracer activity in both kidneys in the urinary bladder. Homogeneous radiotracer activity throughout the axial skeleton including the skull. Degenerative appearing radiotracer activity about both thumbs, knees, and in the right foot. No suspicious radiotracer activity identified in the appendicular skeleton. IMPRESSION: No findings specific for metastatic disease to bone. Electronically Signed   By: Genevie Ann M.D.   On: 03/18/2017 16:13      IMPRESSION/PLAN: 72 y.o. gentleman with a high risk, stage T2b adenocarcinoma of the prostate with a PSA of 8.8 and a Gleason score of 4+5.  We discussed the patient's workup and outlines the nature of prostate cancer in this setting. The patient's T stage, Gleason's score, and PSA put him into the high risk group. Accordingly, he is eligible for a variety of potential treatment options including prostatectomy or LT-ADT in combination with either 8 weeks of external radiation or 5 weeks of external radiation following an upfront brachytherapy boost. We discussed the available radiation techniques, and focused on the details and logistics and delivery. The patient may not be an ideal candidate for primary surgical treatment based on his age as well as his high risk disease.  We discussed that if he chooses  surgery, this will likely require multi-modal treatment and likely involve radiotherapy following surgery.  We discussed and outlined the risks, benefits, short and long-term effects associated with radiotherapy and compared and contrasted these with prostatectomy. We discussed the role of SpaceOAR in reducing the rectal toxicity associated with radiotherapy. We also detailed the role of ADT in the treatment of high risk prostate cancer and outlined the associated side effects that could be expected with this therapy.  At the end of the conversation the patient is most interested in moving forward with LT-ADT in combination with brachytherapy boost followed by 5 weeks of external beam therapy. He has not received his first Lupron injection. We will contact [facility] to make arrangements to initiate ADT with Dr. Jeffie Cline, with plans to schedule his brachytherapy procedure with SpaceOAR approximately 8 weeks therafter. We will see him back approximately 2 weeks following his brachytherapy to proceed with CT SIM planning in preparation for beginning 5 weeks of external beam radiotherapy.  We will share our discussion with Dr. Jeffie Cline and move forward with coordinating a follow-up office visit to initiate ADT and proceed forward from there.   We spent  60 minutes face to face with the patient and more than 50% of that time was spent in counseling and/or coordination of care.     Nicholos Johns, PA-C    Tyler Pita, MD  [facility] Direct Dial: [phone]  Fax: [phone] [email or web address]  Skype  [email or web address]  This document serves as a record of services personally performed by Tyler Pita, MD and Freeman Caldron, PA-C. It was created on their behalf by Bethann Humble, a trained medical scribe. The creation of this record is based on the scribe's personal observations and the provider's statements to them. This document has been checked and approved by the attending provider.

## 2017-04-09 ENCOUNTER — Telehealth: Payer: Self-pay

## 2017-04-09 ENCOUNTER — Telehealth: Payer: Self-pay | Admitting: *Deleted

## 2017-04-09 NOTE — Telephone Encounter (Signed)
Per 3/26 no los

## 2017-04-09 NOTE — Telephone Encounter (Signed)
CALLED PATIENT TO INFORM OF ADT INJ. IN [place] WITH DR. Jeffie Pollock ON 05-23-17 - ARRIVAL TIME - 3:45 PM, LVM FOR A RETURN CALL

## 2017-04-10 ENCOUNTER — Encounter: Payer: Self-pay | Admitting: Medical Oncology

## 2017-04-10 NOTE — Progress Notes (Signed)
                               Care Plan Summary  Name:  DOB:    Your Medical Team:   Urologist -  Dr. Raynelle Bring, [facility]  Radiation Oncologist - Dr. Tyler Pita, [facility]   Medical Oncologist - Dr. Zola Button, [facility]  Recommendations: 1)  2)  3)  * These recommendations are based on information available as of today's consult.      Recommendations may change depending on the results of further tests or exams.    Next Steps: 1)  2) 3)  When appointments need to be scheduled, you will be contacted by [facility] and/or [facility].  Questions?  Please do not hesitate to call Cira Rue, RN, BSN, OCN at [phone] any questions or concerns.  Shirlean Mylar is your Oncology Nurse Navigator and is available to assist you while you're receiving your medical care at [facility].

## 2017-04-14 NOTE — Progress Notes (Signed)
Colon Psychosocial Distress Screening Spiritual Care  Met with Mark Cline, wife Mark Cline, and dtr Mark Cline in Prostate Multidisciplinary Clinic to introduce [name] team/resources, reviewing distress screen per protocol.  The patient scored a 6 on the Psychosocial Distress Thermometer which indicates moderate distress. Also assessed for distress and other psychosocial needs.   ONCBCN DISTRESS SCREENING 04/14/2017  Screening Type Initial Screening  Distress experienced in past week (1-10) 6  Practical problem type Insurance  Emotional problem type Depression;Nervousness/Anxiety;Adjusting to illness  Physical Problem type Getting around;Breathing;Sexual problems;Skin dry/itchy  Referral to support programs Yes   Per pt and family, meeting team and receiving info via [name] has decreased distress and nervousness. Normalized feelings, providing emotional support and introduction to [name] team/resources.  Follow up needed: No. Per pt, no other needs at this time, but he and family know to reach out with any questions, concerns, or support interests. Please also page if needs arise or circumstances change. Thank you.   [place], [place], [facility] Pager [phone] Voicemail [phone]

## 2017-04-15 ENCOUNTER — Telehealth: Payer: Self-pay | Admitting: Medical Oncology

## 2017-04-15 NOTE — Telephone Encounter (Signed)
Called patient to follow up [place] and spoke with his wife. She states things are going well and he is scheduled for ADT 04/25/17 with Dr. Jeffie Pollock in [place].  I informed her that Enid Derry will contact him to schedule seed boost and radiation. She voiced understanding. I asked her to call with questions or concerns.

## 2017-04-16 ENCOUNTER — Telehealth: Payer: Self-pay | Admitting: *Deleted

## 2017-04-16 NOTE — Telephone Encounter (Signed)
CALLED PATIENT TO INFORM OF ADT APPT. BEING MOVED TO 04-25-17 @ 9 AM IN  WITH DR. Jeffie Pollock, LVM FOR A RETURN CALL

## 2017-04-25 ENCOUNTER — Ambulatory Visit: Payer: Medicare Other | Admitting: Urology

## 2017-04-25 DIAGNOSIS — C61 Malignant neoplasm of prostate: Secondary | ICD-10-CM | POA: Diagnosis not present

## 2017-05-05 ENCOUNTER — Encounter: Payer: Self-pay | Admitting: Medical Oncology

## 2017-05-30 ENCOUNTER — Ambulatory Visit: Payer: Medicare Other | Admitting: Urology

## 2017-05-30 DIAGNOSIS — C61 Malignant neoplasm of prostate: Secondary | ICD-10-CM | POA: Diagnosis not present

## 2017-06-16 ENCOUNTER — Telehealth: Payer: Self-pay | Admitting: Medical Oncology

## 2017-06-16 ENCOUNTER — Telehealth: Payer: Self-pay | Admitting: *Deleted

## 2017-06-16 NOTE — Telephone Encounter (Signed)
Called patient to inform of pre-seed planning CT for 06-27-17, spoke with patient and he is aware of this appt.

## 2017-06-16 NOTE — Telephone Encounter (Signed)
Called patient to see if Mark Cline called them back regarding appointments for seed boost and radiation. His wife informed me that Mark Cline did call and they have an appointment 06/27/17 for planning CT. She voiced that her husband is anxious due to the length of time it is taking. I explained treatment started  04/25/17 with the ADT and that they like to wait around 8 weeks after the injection to do the seed boost/radiation. She states this makes her feel better and thanked me for following up.

## 2017-06-16 NOTE — Telephone Encounter (Signed)
Mr. Buczynski called stating that he received his ADT 04/25/17 but has not heard any news about appointments for seed boost and radiation. I forwarded this information to [place] and asked her to reach out to patient.

## 2017-06-17 ENCOUNTER — Other Ambulatory Visit: Payer: Self-pay | Admitting: Urology

## 2017-06-23 ENCOUNTER — Telehealth: Payer: Self-pay | Admitting: *Deleted

## 2017-06-23 NOTE — Telephone Encounter (Signed)
Called patient to inform of pre-seed planning CT and implant, lvm for a return call

## 2017-06-26 ENCOUNTER — Telehealth: Payer: Self-pay | Admitting: *Deleted

## 2017-06-26 NOTE — Telephone Encounter (Signed)
CALLED PATIENT TO REMIND OF APPTS. FOR 06-27-17, LVM FOR A RETURN CALL

## 2017-06-27 ENCOUNTER — Encounter (HOSPITAL_COMMUNITY)
Admission: RE | Admit: 2017-06-27 | Discharge: 2017-06-27 | Disposition: A | Discharge status: Home / Self Care | Payer: Medicare Other | Source: Ambulatory Visit | Attending: Urology | Admitting: Urology

## 2017-06-27 ENCOUNTER — Ambulatory Visit
Admission: RE | Admit: 2017-06-27 | Discharge: 2017-06-27 | Disposition: A | Discharge status: Home / Self Care | Payer: Medicare Other | Source: Ambulatory Visit | Attending: Radiation Oncology | Admitting: Radiation Oncology

## 2017-06-27 ENCOUNTER — Ambulatory Visit (HOSPITAL_COMMUNITY)
Admission: RE | Admit: 2017-06-27 | Discharge: 2017-06-27 | Disposition: A | Discharge status: Home / Self Care | Payer: Medicare Other | Source: Ambulatory Visit | Attending: Urology | Admitting: Urology

## 2017-06-27 DIAGNOSIS — C61 Malignant neoplasm of prostate: Secondary | ICD-10-CM | POA: Insufficient documentation

## 2017-06-27 NOTE — Progress Notes (Signed)
  Radiation Oncology         [phone] ________________________________  Name: Mark Cline MRN: [id number]  Date: 06/27/2017  DOB: 1945/09/30  SIMULATION AND TREATMENT PLANNING NOTE PUBIC ARCH STUDY  [id number], [facility]  Raynelle Bring, MD  DIAGNOSIS: 72 y.o. gentleman with stage T2b adenocarcinoma of the prostate with a Gleason's score of 4+5 and a PSA of 8.8     ICD-10-CM   1. Malignant neoplasm of prostate ([place]) C61     COMPLEX SIMULATION:  The patient presented today for evaluation for possible prostate seed implant. He was brought to the radiation planning suite and placed supine on the CT couch. A 3-dimensional image study set was obtained in upload to the planning computer. There, on each axial slice, I contoured the prostate gland. Then, using three-dimensional radiation planning tools I reconstructed the prostate in view of the structures from the transperineal needle pathway to assess for possible pubic arch interference. In doing so, I did not appreciate any pubic arch interference. Also, the patient's prostate volume was estimated based on the drawn structure. The volume was 25.6 cc.  Given the pubic arch appearance and prostate volume, patient remains a good candidate to proceed with prostate seed implant. Today, he freely provided informed written consent to proceed.    PLAN: The patient will undergo prostate seed implant boost to 110 Gy, then external radiation later to 45 Gy to the pelvis.   ________________________________  Sheral Apley. Tammi Klippel, M.D.    This document serves as a record of services personally performed by Tyler Pita, MD. It was created on his behalf by Margit Banda, a trained medical scribe. The creation of this record is based on the scribe's personal observations and the provider's statements to them. This document has been checked and approved by the attending provider.

## 2017-07-03 ENCOUNTER — Other Ambulatory Visit: Payer: Self-pay | Admitting: Urology

## 2017-07-03 DIAGNOSIS — C61 Malignant neoplasm of prostate: Secondary | ICD-10-CM

## 2017-07-08 ENCOUNTER — Other Ambulatory Visit: Payer: Self-pay | Admitting: Urology

## 2017-07-08 DIAGNOSIS — R911 Solitary pulmonary nodule: Secondary | ICD-10-CM

## 2017-07-14 ENCOUNTER — Ambulatory Visit (HOSPITAL_COMMUNITY)
Admission: RE | Admit: 2017-07-14 | Discharge: 2017-07-14 | Disposition: A | Discharge status: Home / Self Care | Payer: Medicare Other | Source: Ambulatory Visit | Attending: Urology | Admitting: Urology

## 2017-07-14 DIAGNOSIS — J439 Emphysema, unspecified: Secondary | ICD-10-CM | POA: Diagnosis not present

## 2017-07-14 DIAGNOSIS — R911 Solitary pulmonary nodule: Secondary | ICD-10-CM | POA: Diagnosis present

## 2017-07-14 DIAGNOSIS — D71 Functional disorders of polymorphonuclear neutrophils: Secondary | ICD-10-CM | POA: Diagnosis not present

## 2017-07-14 DIAGNOSIS — I700 Atherosclerosis of aorta: Secondary | ICD-10-CM | POA: Insufficient documentation

## 2017-07-14 DIAGNOSIS — I2510 Atherosclerotic heart disease of native coronary artery without angina pectoris: Secondary | ICD-10-CM | POA: Insufficient documentation

## 2017-07-18 ENCOUNTER — Telehealth: Payer: Self-pay | Admitting: *Deleted

## 2017-07-18 NOTE — Telephone Encounter (Signed)
CALLED PATIENT TO INFORM OF LAB APPT. FOR 07-24-17 - ARRIVAL TIME - 10:45 AM @ [facility] ADMITTING, LVM FOR A RETURN CALL

## 2017-07-24 ENCOUNTER — Encounter (HOSPITAL_COMMUNITY)
Admission: RE | Admit: 2017-07-24 | Discharge: 2017-07-24 | Disposition: A | Discharge status: Home / Self Care | Payer: Medicare Other | Source: Ambulatory Visit | Attending: Urology | Admitting: Urology

## 2017-07-24 DIAGNOSIS — C61 Malignant neoplasm of prostate: Secondary | ICD-10-CM | POA: Insufficient documentation

## 2017-07-24 DIAGNOSIS — Z01818 Encounter for other preprocedural examination: Secondary | ICD-10-CM | POA: Insufficient documentation

## 2017-07-24 DIAGNOSIS — Z87891 Personal history of nicotine dependence: Secondary | ICD-10-CM | POA: Diagnosis not present

## 2017-07-24 DIAGNOSIS — Z79899 Other long term (current) drug therapy: Secondary | ICD-10-CM | POA: Insufficient documentation

## 2017-07-24 LAB — COMPREHENSIVE METABOLIC PANEL
ALBUMIN: 4 g/dL (ref 3.5–5.0)
ALT: 19 U/L (ref 0–44)
ANION GAP: 9 (ref 5–15)
AST: 20 U/L (ref 15–41)
Alkaline Phosphatase: 72 U/L (ref 38–126)
BUN: 22 mg/dL (ref 8–23)
CALCIUM: 9.4 mg/dL (ref 8.9–10.3)
CHLORIDE: 105 mmol/L (ref 98–111)
CO2: 27 mmol/L (ref 22–32)
Creatinine, Ser: 0.93 mg/dL (ref 0.61–1.24)
GFR calc non Af Amer: >60 mL/min (ref >60)
GLUCOSE: 86 mg/dL (ref 70–99)
POTASSIUM: 4.5 mmol/L (ref 3.5–5.1)
SODIUM: 141 mmol/L (ref 135–145)
Total Bilirubin: 0.3 mg/dL (ref 0.3–1.2)
Total Protein: 7.4 g/dL (ref 6.5–8.1)

## 2017-07-24 LAB — CBC
HCT: 48.9 % (ref 39.0–52.0)
Hemoglobin: 17 g/dL (ref 13.0–17.0)
MCH: 31.6 pg (ref 26.0–34.0)
MCHC: 34.8 g/dL (ref 30.0–36.0)
MCV: 90.9 fL (ref 78.0–100.0)
Platelets: 206 10*3/uL (ref 150–400)
RBC: 5.38 MIL/uL (ref 4.22–5.81)
RDW: 12.7 % (ref 11.5–15.5)
WBC: 8.5 10*3/uL (ref 4.0–10.5)

## 2017-07-24 LAB — APTT: APTT: 29 s (ref 24–36)

## 2017-07-24 LAB — PROTIME-INR
INR: 0.99
Prothrombin Time: 13 seconds (ref 11.4–15.2)

## 2017-07-29 ENCOUNTER — Encounter (HOSPITAL_BASED_OUTPATIENT_CLINIC_OR_DEPARTMENT_OTHER): Payer: Self-pay | Admitting: *Deleted

## 2017-07-30 ENCOUNTER — Encounter (HOSPITAL_BASED_OUTPATIENT_CLINIC_OR_DEPARTMENT_OTHER): Payer: Self-pay | Admitting: *Deleted

## 2017-07-30 ENCOUNTER — Other Ambulatory Visit: Payer: Self-pay

## 2017-07-30 ENCOUNTER — Telehealth: Payer: Self-pay | Admitting: *Deleted

## 2017-07-30 NOTE — Progress Notes (Signed)
Spoke w/ pt via phone for pre-op interview.  Npo after mn.  Arrive at 0730.  Current lab resutls, ekg, and cxr in chart and epic.  Will to trelegy inhaler am of surgery w/ sips of water and do fleet enema.

## 2017-07-30 NOTE — H&P (Signed)
CC: I have prostate cancer.  HPI: Mark Cline is a 72 year-old male established patient who is here evaluation for treatment of prostate cancer.  He does have the pathology report from his biopsy. His cancer was diagnosed by Dr. Jeffie Pollock. His PSA at his time of diagnosis was 8.8. His most recent PSA is 2.8.   He has not undergone surgery for treatment. He has not undergone External Beam Radiation Therapy for treatment. He has undergone Hormonal Therapy for treatment.   He does not have urinary incontinence. He does have problems with erectile dysfunction. He has not recently had unwanted weight loss. He is not having pain in new locations.   [name] returns today to initate ADT for his Gleason 9 prostate cancer in preparation for radiation therapy. He is doing well without complaints.   05/30/2017: Pt did well with firmagon. PSA decreased to 2.8. Pt due to 45mg  lupron. He will be scheduled in the next month for fiducial markers and SPaceOAR     ALLERGIES: None   MEDICATIONS: Lisinopril     GU PSH: Prostate Needle Biopsy - 02/28/2017    NON-GU PSH: Cholecystectomy (open) Surgical Pathology, Gross And Microscopic Examination For Prostate Needle - 02/28/2017    GU PMH: Prostate Cancer, He will return in a month for a PSA and testosterone and will be converted to Lupron which he will continue for 18-24 months. I will await instructions from Dr. Tammi Klippel for scheduling fiducial markers and SpaceOAR. - 04/25/2017, - 04/08/2017 Elevated PSA - 02/28/2017 Prostate nodule w/o LUTS - 02/28/2017    NON-GU PMH: Arthritis COPD Hypertension    FAMILY HISTORY: No Family History    SOCIAL HISTORY: Marital Status: Married Current Smoking Status: Patient smokes occasionally. Has smoked since 01/18/2017.   Tobacco Use Assessment Completed: Used Tobacco in last 30 days? Has never drank.  Drinks 1 caffeinated drink per day.    REVIEW OF SYSTEMS:    GU Review Male:   Patient denies frequent  urination, hard to postpone urination, burning/ pain with urination, get up at night to urinate, leakage of urine, stream starts and stops, trouble starting your stream, have to strain to urinate , erection problems, and penile pain.  Gastrointestinal (Upper):   Patient denies indigestion/ heartburn, nausea, and vomiting.  Gastrointestinal (Lower):   Patient denies diarrhea and constipation.  Constitutional:   Patient denies fever, night sweats, weight loss, and fatigue.  Skin:   Patient denies skin rash/ lesion and itching.  Eyes:   Patient denies blurred vision and double vision.  Ears/ Nose/ Throat:   Patient denies sore throat and sinus problems.  Hematologic/Lymphatic:   Patient denies swollen glands and easy bruising.  Cardiovascular:   Patient denies leg swelling and chest pains.  Respiratory:   Patient denies cough and shortness of breath.  Endocrine:   Patient denies excessive thirst.  Musculoskeletal:   Patient denies back pain and joint pain.  Neurological:   Patient denies headaches and dizziness.  Psychologic:   Patient denies depression and anxiety.   VITAL SIGNS:      05/30/2017 03:10 PM  Heart Rate 80 /min  Temperature 97.2 F / 36.2 C   MULTI-SYSTEM PHYSICAL EXAMINATION:    Constitutional: Well-nourished. No physical deformities. Normally developed. Good grooming.  Neck: Neck symmetrical, not swollen. Normal tracheal position.  Respiratory: No labored breathing, no use of accessory muscles. Normal breath sounds.  Cardiovascular: Regular rate and rhythm. No murmur, no gallop. Normal temperature, normal extremity pulses, no swelling, no varicosities.  Lymphatic: No enlargement of neck, axillae, groin.  Skin: No paleness, no jaundice, no cyanosis. No lesion, no ulcer, no rash.  Neurologic / Psychiatric: Oriented to time, oriented to place, oriented to person. No depression, no anxiety, no agitation.  Gastrointestinal: No mass, no tenderness, no rigidity, non obese abdomen.   Musculoskeletal: Normal gait and station of head and neck.     PAST DATA REVIEWED:  Source Of History:  Patient  Lab Test Review:   PSA   05/22/17 02/07/17  PSA  Total PSA 2.8 ng/dl 8.8 ng/dl    05/22/17  Hormones  Testosterone, Total 20 pg/dL    PROCEDURES:          Lupron Depot 45 mg / 6 Month - [id number], [id number] The hip was sterilely prepped with alcohol. Lupron was injected intramuscularly (IM) using standard technique. The patient tolerated the procedure well.   Qty: 45 Adm. By: [facility]  Unit: mg Lot No [id number]  Route: IM Exp. Date 10/08/2019  Freq: None Mfgr.:   Site: Right Buttock   ASSESSMENT:      ICD-10 Details  1 GU:   Prostate Cancer - C61    PLAN:           Document Letter(s):  Created for Patient: Clinical Summary         Notes:   Prostate cancer:  -Lupron 45mg  today  -Schedule for fiducial markers and SPaceOAr        Next Appointment:      Next Appointment: 12/05/2017 10:30 AM    Appointment Type: Office Visit Established Patient    Location: [facility] [address]    Provider: [facility]    Reason for Visit: 6 mo followup w/ Lupron

## 2017-07-30 NOTE — Telephone Encounter (Signed)
Called patient to remind of procedure for 07-31-17, lvm for a return call

## 2017-07-31 ENCOUNTER — Ambulatory Visit (HOSPITAL_BASED_OUTPATIENT_CLINIC_OR_DEPARTMENT_OTHER): Payer: Medicare Other | Admitting: Anesthesiology

## 2017-07-31 ENCOUNTER — Ambulatory Visit (HOSPITAL_BASED_OUTPATIENT_CLINIC_OR_DEPARTMENT_OTHER)
Admission: RE | Admit: 2017-07-31 | Discharge: 2017-07-31 | Disposition: A | Discharge status: Home / Self Care | Payer: Medicare Other | Source: Ambulatory Visit | Attending: Urology | Admitting: Urology

## 2017-07-31 ENCOUNTER — Encounter (HOSPITAL_BASED_OUTPATIENT_CLINIC_OR_DEPARTMENT_OTHER)
Admission: RE | Disposition: A | Discharge status: Home / Self Care | Payer: Self-pay | Source: Ambulatory Visit | Attending: Urology

## 2017-07-31 ENCOUNTER — Ambulatory Visit (HOSPITAL_COMMUNITY): Payer: Medicare Other

## 2017-07-31 ENCOUNTER — Encounter (HOSPITAL_BASED_OUTPATIENT_CLINIC_OR_DEPARTMENT_OTHER): Payer: Self-pay | Admitting: *Deleted

## 2017-07-31 DIAGNOSIS — I739 Peripheral vascular disease, unspecified: Secondary | ICD-10-CM | POA: Insufficient documentation

## 2017-07-31 DIAGNOSIS — C61 Malignant neoplasm of prostate: Secondary | ICD-10-CM

## 2017-07-31 DIAGNOSIS — I714 Abdominal aortic aneurysm, without rupture: Secondary | ICD-10-CM | POA: Diagnosis not present

## 2017-07-31 DIAGNOSIS — J449 Chronic obstructive pulmonary disease, unspecified: Secondary | ICD-10-CM | POA: Insufficient documentation

## 2017-07-31 DIAGNOSIS — F17200 Nicotine dependence, unspecified, uncomplicated: Secondary | ICD-10-CM | POA: Diagnosis not present

## 2017-07-31 DIAGNOSIS — I10 Essential (primary) hypertension: Secondary | ICD-10-CM | POA: Diagnosis not present

## 2017-07-31 HISTORY — DX: Acquired absence of unspecified finger(s): Z89.029

## 2017-07-31 HISTORY — DX: Unspecified osteoarthritis, unspecified site: M19.90

## 2017-07-31 HISTORY — DX: Presence of spectacles and contact lenses: Z97.3

## 2017-07-31 HISTORY — DX: Other forms of dyspnea: R06.09

## 2017-07-31 HISTORY — DX: Presence of dental prosthetic device (complete) (partial): Z97.2

## 2017-07-31 HISTORY — DX: Abdominal aortic aneurysm, without rupture, unspecified: I71.40

## 2017-07-31 HISTORY — PX: RADIOACTIVE SEED IMPLANT: SHX5150

## 2017-07-31 HISTORY — DX: Essential (primary) hypertension: I10

## 2017-07-31 HISTORY — DX: Abdominal aortic aneurysm, without rupture: I71.4

## 2017-07-31 HISTORY — DX: Emphysema, unspecified: J43.9

## 2017-07-31 HISTORY — PX: SPACE OAR INSTILLATION: SHX6769

## 2017-07-31 HISTORY — DX: Dyspnea, unspecified: R06.00

## 2017-07-31 SURGERY — INSERTION, RADIATION SOURCE, PROSTATE
Anesthesia: General

## 2017-07-31 MED ORDER — LACTATED RINGERS IV SOLN
INTRAVENOUS | Status: DC
  Administered 2017-07-31 (×3): via INTRAVENOUS
  Filled 2017-07-31: qty 1000

## 2017-07-31 MED ORDER — ONDANSETRON HCL 4 MG/2ML IJ SOLN
INTRAMUSCULAR | Status: DC | PRN
  Administered 2017-07-31: 4 mg via INTRAVENOUS

## 2017-07-31 MED ORDER — SODIUM CHLORIDE 0.9 % IJ SOLN
INTRAMUSCULAR | Status: DC | PRN
  Administered 2017-07-31: 10 mL

## 2017-07-31 MED ORDER — MEPERIDINE HCL 25 MG/ML IJ SOLN
6.2500 mg | INTRAMUSCULAR | Status: DC | PRN
  Filled 2017-07-31: qty 1

## 2017-07-31 MED ORDER — IOHEXOL 300 MG/ML  SOLN
INTRAMUSCULAR | Status: DC | PRN
  Administered 2017-07-31: 5 mL via INTRAVENOUS

## 2017-07-31 MED ORDER — PROPOFOL 10 MG/ML IV BOLUS
INTRAVENOUS | Status: DC | PRN
  Administered 2017-07-31: 140 mg via INTRAVENOUS

## 2017-07-31 MED ORDER — TRAMADOL HCL 50 MG PO TABS: 50.0000 mg | ORAL_TABLET | Freq: Four times a day (QID) | ORAL | 0 refills | Status: AC | PRN

## 2017-07-31 MED ORDER — FENTANYL CITRATE (PF) 100 MCG/2ML IJ SOLN
25.0000 ug | INTRAMUSCULAR | Status: DC | PRN
  Filled 2017-07-31: qty 1

## 2017-07-31 MED ORDER — FLEET ENEMA 7-19 GM/118ML RE ENEM
1.0000 | ENEMA | Freq: Once | RECTAL | Status: DC
  Filled 2017-07-31: qty 1

## 2017-07-31 MED ORDER — CIPROFLOXACIN IN D5W 400 MG/200ML IV SOLN
400.0000 mg | INTRAVENOUS | Status: AC
  Administered 2017-07-31: 400 mg via INTRAVENOUS
  Filled 2017-07-31: qty 200

## 2017-07-31 MED ORDER — CIPROFLOXACIN IN D5W 400 MG/200ML IV SOLN
INTRAVENOUS | Status: AC
  Filled 2017-07-31: qty 200

## 2017-07-31 MED ORDER — EPHEDRINE SULFATE 50 MG/ML IJ SOLN
INTRAMUSCULAR | Status: DC | PRN
  Administered 2017-07-31 (×3): 10 mg via INTRAVENOUS

## 2017-07-31 MED ORDER — DEXAMETHASONE SODIUM PHOSPHATE 10 MG/ML IJ SOLN
INTRAMUSCULAR | Status: DC | PRN
  Administered 2017-07-31: 10 mg via INTRAVENOUS

## 2017-07-31 MED ORDER — FENTANYL CITRATE (PF) 100 MCG/2ML IJ SOLN
INTRAMUSCULAR | Status: DC | PRN
  Administered 2017-07-31 (×2): 50 ug via INTRAVENOUS

## 2017-07-31 MED ORDER — LIDOCAINE 2% (20 MG/ML) 5 ML SYRINGE
INTRAMUSCULAR | Status: DC | PRN
  Administered 2017-07-31: 60 mg via INTRAVENOUS

## 2017-07-31 MED ORDER — SODIUM CHLORIDE 0.9 % IV SOLN
INTRAVENOUS | Status: DC | PRN
  Administered 2017-07-31: 80 ug/min via INTRAVENOUS

## 2017-07-31 SURGICAL SUPPLY — 38 items
BAG URINE DRAINAGE (UROLOGICAL SUPPLIES) ×3 IMPLANT
BLADE CLIPPER SURG (BLADE) ×3 IMPLANT
CATH FOLEY 2WAY SLVR  5CC 16FR (CATHETERS) ×2
CATH FOLEY 2WAY SLVR 5CC 16FR (CATHETERS) ×1 IMPLANT
CATH ROBINSON RED A/P 16FR (CATHETERS) IMPLANT
CATH ROBINSON RED A/P 20FR (CATHETERS) ×3 IMPLANT
CLOTH BEACON ORANGE TIMEOUT ST (SAFETY) ×3 IMPLANT
CONT SPECI 4OZ STER CLIK (MISCELLANEOUS) ×6 IMPLANT
COVER BACK TABLE 60X90IN (DRAPES) ×3 IMPLANT
COVER MAYO STAND STRL (DRAPES) ×3 IMPLANT
DRSG TEGADERM 4X4.75 (GAUZE/BANDAGES/DRESSINGS) ×6 IMPLANT
DRSG TEGADERM 8X12 (GAUZE/BANDAGES/DRESSINGS) ×6 IMPLANT
GLOVE BIO SURGEON STRL SZ 6 (GLOVE) IMPLANT
GLOVE BIO SURGEON STRL SZ7 (GLOVE) IMPLANT
GLOVE BIO SURGEON STRL SZ8 (GLOVE) IMPLANT
GLOVE BIOGEL PI IND STRL 6 (GLOVE) IMPLANT
GLOVE BIOGEL PI IND STRL 8 (GLOVE) IMPLANT
GLOVE BIOGEL PI INDICATOR 6 (GLOVE)
GLOVE BIOGEL PI INDICATOR 8 (GLOVE)
GLOVE ECLIPSE 8.0 STRL XLNG CF (GLOVE) ×3 IMPLANT
GLOVE INDICATOR 7.0 STRL GRN (GLOVE) IMPLANT
GLOVE SURG SS PI 8.0 STRL IVOR (GLOVE) ×6 IMPLANT
GOWN STRL REUS W/ TWL XL LVL3 (GOWN DISPOSABLE) ×1 IMPLANT
GOWN STRL REUS W/TWL XL LVL3 (GOWN DISPOSABLE) ×5 IMPLANT
HOLDER FOLEY CATH W/STRAP (MISCELLANEOUS) ×3 IMPLANT
I-seed agx100 ×243 IMPLANT
IMPL SPACEOAR SYSTEM 10ML (MISCELLANEOUS) ×1 IMPLANT
IMPLANT SPACEOAR SYSTEM 10ML (MISCELLANEOUS) ×3
IV NS 1000ML (IV SOLUTION) ×2
IV NS 1000ML BAXH (IV SOLUTION) ×1 IMPLANT
KIT TURNOVER CYSTO (KITS) ×3 IMPLANT
MARKER SKIN DUAL TIP RULER LAB (MISCELLANEOUS) ×3 IMPLANT
PACK CYSTO (CUSTOM PROCEDURE TRAY) ×3 IMPLANT
SURGILUBE 2OZ TUBE FLIPTOP (MISCELLANEOUS) IMPLANT
SYR 10ML LL (SYRINGE) IMPLANT
UNDERPAD 30X30 (UNDERPADS AND DIAPERS) ×6 IMPLANT
WATER STERILE IRR 3000ML UROMA (IV SOLUTION) ×3 IMPLANT
WATER STERILE IRR 500ML POUR (IV SOLUTION) ×3 IMPLANT

## 2017-07-31 NOTE — Anesthesia Procedure Notes (Signed)
Procedure Name: LMA Insertion Date/Time: 07/31/2017 9:47 AM Performed by: Wanita Chamberlain, CRNA Pre-anesthesia Checklist: Patient identified, Timeout performed, Emergency Drugs available, Suction available and Patient being monitored Patient Re-evaluated:Patient Re-evaluated prior to induction Oxygen Delivery Method: Circle system utilized Preoxygenation: Pre-oxygenation with 100% oxygen Induction Type: IV induction Ventilation: Mask ventilation without difficulty LMA: LMA inserted LMA Size: 4.0 Number of attempts: 1 Placement Confirmation: breath sounds checked- equal and bilateral,  CO2 detector and positive ETCO2 Tube secured with: Tape Dental Injury: Teeth and Oropharynx as per pre-operative assessment

## 2017-07-31 NOTE — Anesthesia Preprocedure Evaluation (Addendum)
Anesthesia Evaluation  Patient identified by MRN, date of birth, ID band Patient awake    Reviewed: Allergy & Precautions, H&P , NPO status , Patient's Chart, lab work & pertinent test results, reviewed documented beta blocker date and time   Airway Mallampati: II  TM Distance: >3 FB Neck ROM: full    Dental  (+) Poor Dentition, Chipped, Missing, Dental Advisory Given,    Pulmonary COPD,  COPD inhaler, former smoker,    Pulmonary exam normal breath sounds clear to auscultation       Cardiovascular Exercise Tolerance: Poor hypertension, Pt. on medications + Peripheral Vascular Disease   Rhythm:regular Rate:Normal  AAA (abdominal aortic aneurysm) ([place])  infarenal , per CT 03-17-2017 , 4.4cm    Neuro/Psych    GI/Hepatic   Endo/Other    Renal/GU   negative genitourinary   Musculoskeletal  (+) Arthritis ,   Abdominal   Peds  Hematology   Anesthesia Other Findings   Reproductive/Obstetrics negative OB ROS                           Anesthesia Physical Anesthesia Plan  ASA: III  Anesthesia Plan: General   Post-op Pain Management:    Induction: Intravenous  PONV Risk Score and Plan: 2 and Treatment may vary due to age or medical condition, Ondansetron and Dexamethasone  Airway Management Planned: LMA and Oral ETT  Additional Equipment:   Intra-op Plan:   Post-operative Plan: Extubation in OR  Informed Consent: I have reviewed the patients History and Physical, chart, labs and discussed the procedure including the risks, benefits and alternatives for the proposed anesthesia with the patient or authorized representative who has indicated his/her understanding and acceptance.   Dental Advisory Given  Plan Discussed with: CRNA, Anesthesiologist and Surgeon  Anesthesia Plan Comments: ( )        Anesthesia Quick Evaluation

## 2017-07-31 NOTE — Anesthesia Postprocedure Evaluation (Signed)
Anesthesia Post Note  Patient: Mark Cline  Procedure(s) Performed: RADIOACTIVE SEED IMPLANT/BRACHYTHERAPY IMPLANT (N/A ) SPACE OAR INSTILLATION (N/A )     Patient location during evaluation: PACU Anesthesia Type: General Level of consciousness: awake and alert Pain management: pain level controlled Vital Signs Assessment: post-procedure vital signs reviewed and stable Respiratory status: spontaneous breathing, nonlabored ventilation, respiratory function stable and patient connected to nasal cannula oxygen Cardiovascular status: blood pressure returned to baseline and stable Postop Assessment: no apparent nausea or vomiting Anesthetic complications: no    Last Vitals:  Vitals:   07/31/17 1112 07/31/17 1115  BP: 127/80 126/63  Pulse: 78 76  Resp: 17 (!) 21  Temp: 36.8 C   SpO2: 100% 100%    Last Pain:  Vitals:   07/31/17 1112  TempSrc:   PainSc: Asleep                 Dymir Neeson

## 2017-07-31 NOTE — Interval H&P Note (Signed)
History and Physical Interval Note:  07/31/2017 9:35 AM  Mark Cline  has presented today for surgery, with the diagnosis of PROSTATE CANCER  The various methods of treatment have been discussed with the patient and family. After consideration of risks, benefits and other options for treatment, the patient has consented to  Procedure(s): RADIOACTIVE SEED IMPLANT/BRACHYTHERAPY IMPLANT (N/A) SPACE OAR INSTILLATION (N/A) as a surgical intervention .  The patient's history has been reviewed, patient examined, no change in status, stable for surgery.  I have reviewed the patient's chart and labs.  Questions were answered to the patient's satisfaction.     Irine Seal

## 2017-07-31 NOTE — Op Note (Signed)
PATIENT:  Mark Cline  PRE-OPERATIVE DIAGNOSIS:  Adenocarcinoma of the prostate  POST-OPERATIVE DIAGNOSIS:  Same  PROCEDURE:  Procedure(s): 1. I-125 radioactive seed implantation 2. SpaceOAR. 3. Cystoscopy  SURGEON:  Surgeon(s): Irine Seal MD  Radiation oncologist: Dr. Tyler Pita  ANESTHESIA:  General  EBL:  Minimal  DRAINS: 87 French Foley catheter  INDICATION: [name] is a 72 y.o. with Stage T2a N0 M0, Gleason 9(4+5) prostate cancer who has elected brachytherapy in combination with ADT and EXRT for treatment.  Description of procedure: After informed consent the patient was brought to the major OR, placed on the table and administered general anesthesia. He was then moved to the modified lithotomy position with his perineum perpendicular to the floor. His perineum and genitalia were then sterilely prepped. An official timeout was then performed. A 16 French Foley catheter was then placed in the bladder and filled with dilute contrast, a rectal tube was placed in the rectum and the transrectal ultrasound probe was placed in the rectum and affixed to the stand. He was then sterilely draped.  The sterile grid was installed.   Anchor needles were then placed.   Real time ultrasonography was used along with the seed planning software spot-pro version 3.1-00. This was used to develop the seed plan including the number of needles as well as number of seeds required for complete and adequate coverage. Real-time ultrasonography was then used along with the previously developed plan with the Nucletron device to manually implant a total of 81 stranded seeds using 29 needles for a target dose of 110 Gy. This proceeded without difficulty or complication.  The stabilization needles and guide were removed and the SpaceOAR need was passed under US guidance into the fat plane between the prostate and rectum.  NS was puffed into the space to confirm placement and the SpaceOAR gel was then  injected over 10 seconds with particular attention to the apical prostate where posterior seeds were located.  An excellent distribution with uniform prostato-rectal separation was noted.  A Foley catheter was then removed as well as the transrectal ultrasound probe and rectal probe. Flexible cystoscopy was then performed using the 17 French flexible scope which revealed a normal urethra throughout its length down to the sphincter which appeared intact. The prostatic urethra was 3cm with bilobar hyperplasia with coaptation. The bladder was then entered and fully and systematically inspected.  The ureteral orifices were noted to be of normal configuration and position. The mucosa revealed no evidence of tumors. There was mild trabeculation. There were also no stones identified within the bladder.  No seeds or spacers were seen and/or removed from the bladder.  The cystoscope was then removed.  The drapes were removed.  The perineum was cleaned and dressed.  He was taken out of the lithotomy position and was awakened and taken to recovery room in stable and satisfactory condition. He tolerated procedure well and there were no intraoperative complications.

## 2017-07-31 NOTE — Progress Notes (Signed)
  Radiation Oncology         [phone] ________________________________  Name: Mark Cline MRN: [id number]  Date: 07/31/2017  DOB: [birth year]       Prostate Seed Implant  VZ:[facility]  No ref. provider found  DIAGNOSIS: 72 y.o. gentleman with stage T2b adenocarcinoma of the prostate with a Gleason's score of 4+5 and a PSA of 8.8   ICD-10-CM   1. Prostate cancer ([place]) C61 DG Chest 2 View    DG Chest 2 View    PROCEDURE: Insertion of radioactive I-125 seeds into the prostate gland.  RADIATION DOSE: 110 Gy, boost therapy.  TECHNIQUE: BERTRAM HADDIX was brought to the operating room with the urologist. He was placed in the dorsolithotomy position. He was catheterized and a rectal tube was inserted. The perineum was shaved, prepped and draped. The ultrasound probe was then introduced into the rectum to see the prostate gland.  TREATMENT DEVICE: A needle grid was attached to the ultrasound probe stand and anchor needles were placed.  3D PLANNING: The prostate was imaged in 3D using a sagittal sweep of the prostate probe. These images were transferred to the planning computer. There, the prostate, urethra and rectum were defined on each axial reconstructed image. Then, the software created an optimized 3D plan and a few seed positions were adjusted. The quality of the plan was reviewed using [facility] information for the target and the following two organs at risk:  Urethra and Rectum.  Then the accepted plan was printed and handed off to the radiation therapist.  Under my supervision, the custom loading of the seeds and spacers was carried out and loaded into sealed vicryl sleeves.  These pre-loaded needles were then placed into the needle holder.Marland Kitchen  PROSTATE VOLUME STUDY:  Using transrectal ultrasound the volume of the prostate was verified to be 34.7 cc.  SPECIAL TREATMENT PROCEDURE/SUPERVISION AND HANDLING: The pre-loaded needles were then delivered under sagittal guidance. A  total of 29 needles were used to deposit 81 seeds in the prostate gland. The individual seed activity was 0.276 mCi.  SpaceOAR:  Yes  COMPLEX SIMULATION: At the end of the procedure, an anterior radiograph of the pelvis was obtained to document seed positioning and count. Cystoscopy was performed to check the urethra and bladder.  MICRODOSIMETRY: At the end of the procedure, the patient was emitting 0.069 mR/hr at 1 meter. Accordingly, he was considered safe for hospital discharge.  PLAN: The patient will return to the radiation oncology clinic for post implant CT dosimetry in three weeks.   ________________________________  Sheral Apley Tammi Klippel, M.D.

## 2017-07-31 NOTE — Transfer of Care (Signed)
Immediate Anesthesia Transfer of Care Note  Patient: Mark Cline  Procedure(s) Performed: RADIOACTIVE SEED IMPLANT/BRACHYTHERAPY IMPLANT (N/A ) SPACE OAR INSTILLATION (N/A )  Patient Location: PACU  Anesthesia Type:General  Level of Consciousness: awake, alert , oriented and patient cooperative  Airway & Oxygen Therapy: Patient Spontanous Breathing and Patient connected to nasal cannula oxygen  Post-op Assessment: Report given to RN and Post -op Vital signs reviewed and stable  Post vital signs: Reviewed and stable  Last Vitals:  Vitals Value Taken Time  BP 127/80 07/31/2017 11:12 AM  Temp    Pulse 80 07/31/2017 11:13 AM  Resp 18 07/31/2017 11:13 AM  SpO2 100 % 07/31/2017 11:13 AM  Vitals shown include unvalidated device data.  Last Pain:  Vitals:   07/31/17 0743  TempSrc:   PainSc: 0-No pain         Complications: No apparent anesthesia complications

## 2017-07-31 NOTE — Discharge Instructions (Addendum)
Brachytherapy for Prostate Cancer, Care After Refer to this sheet in the next few weeks. These instructions provide you with information on caring for yourself after your procedure. Your health care provider may also give you more specific instructions. Your treatment has been planned according to current medical practices, but problems sometimes occur. Call your health care provider if you have any problems or questions after your procedure. What can I expect after the procedure? The area behind the scrotum will probably be tender and bruised. For a short period of time you may have:  Difficulty passing urine. You may need a catheter for a few days to a month.  Blood in the urine or semen.  A feeling of constipation because of prostate swelling.  Frequent feeling of an urgent need to urinate.  For a long period of time you may have:  Inflammation of the rectum. This happens in about 2% of people who have the procedure.  Erection problems. These vary with age and occur in about 15-40% of men.  Difficulty urinating. This is caused by scarring in the urethra.  Diarrhea.  Follow these instructions at home:  Take medicines only as directed by your health care provider.  You will probably have a catheter in your bladder for several days. You will have blood in the urine bag and should drink a lot of fluids to keep it a light red color.  Keep all follow-up visits as directed by your health care provider. If you have a catheter, it will be removed during one of these visits.  Try not to sit directly on the area behind the scrotum. A soft cushion can decrease the discomfort. Ice packs may also be helpful for the discomfort. Do not put ice directly on the skin.  Shower and wash the area behind the scrotum gently. Do not sit in a tub.  If you have had the brachytherapy that uses the seeds, limit your close contact with children and pregnant women for 2 months because of the radiation still  in the prostate. After that period of time, the levels drop off quickly. Get help right away if:  You have a fever.  You have chills.  You have shortness of breath.  You have chest pain.  You have thick blood, like tomato juice, in the urine bag.  Your catheter is blocked so urine cannot get into the bag. Your bladder area or lower abdomen may be swollen.  There is excessive bleeding from your rectum. It is normal to have a little blood mixed with your stool.  There is severe discomfort in the treated area that does not go away with pain medicine.  You have abdominal discomfort.  You have severe nausea or vomiting.  You develop any new or unusual symptoms. This information is not intended to replace advice given to you by your health care provider. Make sure you discuss any questions you have with your health care provider. Document Released: 02/02/2010 Document Revised: 06/14/2015 Document Reviewed: 06/23/2012 Elsevier Interactive Patient Education  2017 [facility] Care Instructions  Activity: Get plenty of rest for the remainder of the day. A responsible individual must stay with you for 24 hours following the procedure.  For the next 24 hours, DO NOT: -Drive a car -Paediatric nurse -Drink alcoholic beverages -Take any medication unless instructed by your physician -Make any legal decisions or sign important papers.  Meals: Start with liquid foods such as gelatin or soup. Progress to regular foods  as tolerated. Avoid greasy, spicy, heavy foods. If nausea and/or vomiting occur, drink only clear liquids until the nausea and/or vomiting subsides. Call your physician if vomiting continues.  Special Instructions/Symptoms: Your throat may feel dry or sore from the anesthesia or the breathing tube placed in your throat during surgery. If this causes discomfort, gargle with warm salt water. The discomfort should disappear within 24 hours.  If you had  a scopolamine patch placed behind your ear for the management of post- operative nausea and/or vomiting:  1. The medication in the patch is effective for 72 hours, after which it should be removed.  Wrap patch in a tissue and discard in the trash. Wash hands thoroughly with soap and water. 2. You may remove the patch earlier than 72 hours if you experience unpleasant side effects which may include dry mouth, dizziness or visual disturbances. 3. Avoid touching the patch. Wash your hands with soap and water after contact with the patch.

## 2017-08-01 ENCOUNTER — Encounter (HOSPITAL_BASED_OUTPATIENT_CLINIC_OR_DEPARTMENT_OTHER): Payer: Self-pay | Admitting: Urology

## 2017-08-13 ENCOUNTER — Telehealth: Payer: Self-pay | Admitting: *Deleted

## 2017-08-13 NOTE — Telephone Encounter (Signed)
CALLED PATIENT TO INFORM OF MRI FOR 08-16-17 - ARRIVAL TIME - 11:30 AM @ [facility], NO RESTRICTIONS TO TEST , TEST TO BE @ [facility], SPOKE WITH PATIENT AND HE IS AWARE OF THIS TEST

## 2017-08-14 ENCOUNTER — Telehealth: Payer: Self-pay | Admitting: *Deleted

## 2017-08-14 NOTE — Telephone Encounter (Signed)
CALLED PATIENT TO REMIND OF SIM APPT. FOR 08-15-17 AND HIS MRI FOR 08-16-17, SPOKE WITH PATIENT AND HE IS AWARE OF THESE APPTS.

## 2017-08-15 ENCOUNTER — Ambulatory Visit
Admission: RE | Admit: 2017-08-15 | Discharge: 2017-08-15 | Disposition: A | Discharge status: Home / Self Care | Payer: Medicare Other | Source: Ambulatory Visit | Attending: Radiation Oncology | Admitting: Radiation Oncology

## 2017-08-15 ENCOUNTER — Encounter: Payer: Self-pay | Admitting: Medical Oncology

## 2017-08-15 DIAGNOSIS — C61 Malignant neoplasm of prostate: Secondary | ICD-10-CM | POA: Diagnosis present

## 2017-08-15 DIAGNOSIS — Z510 Encounter for antineoplastic radiation therapy: Secondary | ICD-10-CM | POA: Diagnosis not present

## 2017-08-15 NOTE — Progress Notes (Signed)
  Radiation Oncology         [phone] ________________________________  Name: Mark Cline MRN: [id number]  Date: 08/15/2017  DOB: [birth year]  SIMULATION AND TREATMENT PLANNING NOTE    ICD-10-CM   1. Malignant neoplasm of prostate ([place]) C61     DIAGNOSIS:  72 y.o. gentleman with stage T2b adenocarcinoma of the prostate with a Gleason's score of 4+5 and a PSA of 8.8   NARRATIVE:  The patient was brought to the [facility].  Identity was confirmed.  All relevant records and images related to the planned course of therapy were reviewed.  The patient freely provided informed written consent to proceed with treatment after reviewing the details related to the planned course of therapy. The consent form was witnessed and verified by the simulation staff.  Then, the patient was set-up in a stable reproducible supine position for radiation therapy.  A vacuum lock pillow device was custom fabricated to position his legs in a reproducible immobilized position.  Then, I performed a urethrogram under sterile conditions to identify the prostatic apex.  CT images were obtained.  Surface markings were placed.  The CT images were loaded into the planning software.  Then the prostate target and avoidance structures including the rectum, bladder, bowel and hips were contoured.  Treatment planning then occurred.  The radiation prescription was entered and confirmed.  A total of one complex treatment devices were fabricated. I have requested : Intensity Modulated Radiotherapy (IMRT) is medically necessary for this case for the following reason:  Rectal sparing.Marland Kitchen  PLAN:  The patient will receive 45 Gy in 25 fractions of 1.8 Gy, to supplement an up-front prostate seed implant boost of 110 Gy to achieve a total nominal dose of 165 Gy.  ________________________________  Sheral Apley Tammi Klippel, M.D.

## 2017-08-16 ENCOUNTER — Ambulatory Visit (HOSPITAL_COMMUNITY)
Admission: RE | Admit: 2017-08-16 | Discharge: 2017-08-16 | Disposition: A | Discharge status: Home / Self Care | Payer: Medicare Other | Source: Ambulatory Visit | Attending: Urology | Admitting: Urology

## 2017-08-16 DIAGNOSIS — C61 Malignant neoplasm of prostate: Secondary | ICD-10-CM | POA: Diagnosis present

## 2017-08-21 DIAGNOSIS — C61 Malignant neoplasm of prostate: Secondary | ICD-10-CM | POA: Diagnosis not present

## 2017-08-22 ENCOUNTER — Ambulatory Visit: Payer: Medicare Other | Admitting: Urology

## 2017-08-22 DIAGNOSIS — R5383 Other fatigue: Secondary | ICD-10-CM

## 2017-08-22 DIAGNOSIS — C61 Malignant neoplasm of prostate: Secondary | ICD-10-CM

## 2017-08-26 ENCOUNTER — Encounter: Payer: Self-pay | Admitting: Medical Oncology

## 2017-08-26 ENCOUNTER — Ambulatory Visit
Admission: RE | Admit: 2017-08-26 | Discharge: 2017-08-26 | Disposition: A | Discharge status: Home / Self Care | Payer: Medicare Other | Source: Ambulatory Visit | Attending: Radiation Oncology | Admitting: Radiation Oncology

## 2017-08-26 DIAGNOSIS — C61 Malignant neoplasm of prostate: Secondary | ICD-10-CM | POA: Diagnosis not present

## 2017-08-27 ENCOUNTER — Ambulatory Visit
Admission: RE | Admit: 2017-08-27 | Discharge: 2017-08-27 | Disposition: A | Discharge status: Home / Self Care | Payer: Medicare Other | Source: Ambulatory Visit | Attending: Radiation Oncology | Admitting: Radiation Oncology

## 2017-08-27 DIAGNOSIS — C61 Malignant neoplasm of prostate: Secondary | ICD-10-CM | POA: Diagnosis not present

## 2017-08-28 ENCOUNTER — Ambulatory Visit
Admission: RE | Admit: 2017-08-28 | Discharge: 2017-08-28 | Disposition: A | Discharge status: Home / Self Care | Payer: Medicare Other | Source: Ambulatory Visit | Attending: Radiation Oncology | Admitting: Radiation Oncology

## 2017-08-28 DIAGNOSIS — C61 Malignant neoplasm of prostate: Secondary | ICD-10-CM | POA: Diagnosis not present

## 2017-08-29 ENCOUNTER — Ambulatory Visit
Admission: RE | Admit: 2017-08-29 | Discharge: 2017-08-29 | Disposition: A | Discharge status: Home / Self Care | Payer: Medicare Other | Source: Ambulatory Visit | Attending: Radiation Oncology | Admitting: Radiation Oncology

## 2017-08-29 DIAGNOSIS — C61 Malignant neoplasm of prostate: Secondary | ICD-10-CM | POA: Diagnosis not present

## 2017-08-31 ENCOUNTER — Ambulatory Visit: Admission: RE | Admit: 2017-08-31 | Payer: Medicare Other | Source: Ambulatory Visit

## 2017-09-01 ENCOUNTER — Ambulatory Visit
Admission: RE | Admit: 2017-09-01 | Discharge: 2017-09-01 | Disposition: A | Discharge status: Home / Self Care | Payer: Medicare Other | Source: Ambulatory Visit | Attending: Radiation Oncology | Admitting: Radiation Oncology

## 2017-09-01 DIAGNOSIS — C61 Malignant neoplasm of prostate: Secondary | ICD-10-CM | POA: Diagnosis not present

## 2017-09-02 ENCOUNTER — Ambulatory Visit
Admission: RE | Admit: 2017-09-02 | Discharge: 2017-09-02 | Disposition: A | Discharge status: Home / Self Care | Payer: Medicare Other | Source: Ambulatory Visit | Attending: Radiation Oncology | Admitting: Radiation Oncology

## 2017-09-02 DIAGNOSIS — C61 Malignant neoplasm of prostate: Secondary | ICD-10-CM | POA: Diagnosis not present

## 2017-09-03 ENCOUNTER — Ambulatory Visit
Admission: RE | Admit: 2017-09-03 | Discharge: 2017-09-03 | Disposition: A | Discharge status: Home / Self Care | Payer: Medicare Other | Source: Ambulatory Visit | Attending: Radiation Oncology | Admitting: Radiation Oncology

## 2017-09-03 DIAGNOSIS — C61 Malignant neoplasm of prostate: Secondary | ICD-10-CM | POA: Diagnosis not present

## 2017-09-04 ENCOUNTER — Ambulatory Visit
Admission: RE | Admit: 2017-09-04 | Discharge: 2017-09-04 | Disposition: A | Discharge status: Home / Self Care | Payer: Medicare Other | Source: Ambulatory Visit | Attending: Radiation Oncology | Admitting: Radiation Oncology

## 2017-09-04 DIAGNOSIS — C61 Malignant neoplasm of prostate: Secondary | ICD-10-CM | POA: Diagnosis not present

## 2017-09-05 ENCOUNTER — Ambulatory Visit
Admission: RE | Admit: 2017-09-05 | Discharge: 2017-09-05 | Disposition: A | Discharge status: Home / Self Care | Payer: Medicare Other | Source: Ambulatory Visit | Attending: Radiation Oncology | Admitting: Radiation Oncology

## 2017-09-05 DIAGNOSIS — C61 Malignant neoplasm of prostate: Secondary | ICD-10-CM | POA: Diagnosis not present

## 2017-09-08 ENCOUNTER — Ambulatory Visit
Admission: RE | Admit: 2017-09-08 | Discharge: 2017-09-08 | Disposition: A | Discharge status: Home / Self Care | Payer: Medicare Other | Source: Ambulatory Visit | Attending: Radiation Oncology | Admitting: Radiation Oncology

## 2017-09-08 DIAGNOSIS — C61 Malignant neoplasm of prostate: Secondary | ICD-10-CM | POA: Diagnosis not present

## 2017-09-09 ENCOUNTER — Ambulatory Visit
Admission: RE | Admit: 2017-09-09 | Discharge: 2017-09-09 | Disposition: A | Discharge status: Home / Self Care | Payer: Medicare Other | Source: Ambulatory Visit | Attending: Radiation Oncology | Admitting: Radiation Oncology

## 2017-09-09 DIAGNOSIS — C61 Malignant neoplasm of prostate: Secondary | ICD-10-CM | POA: Diagnosis not present

## 2017-09-10 ENCOUNTER — Ambulatory Visit
Admission: RE | Admit: 2017-09-10 | Discharge: 2017-09-10 | Disposition: A | Discharge status: Home / Self Care | Payer: Medicare Other | Source: Ambulatory Visit | Attending: Radiation Oncology | Admitting: Radiation Oncology

## 2017-09-10 DIAGNOSIS — C61 Malignant neoplasm of prostate: Secondary | ICD-10-CM | POA: Diagnosis not present

## 2017-09-11 ENCOUNTER — Ambulatory Visit
Admission: RE | Admit: 2017-09-11 | Discharge: 2017-09-11 | Disposition: A | Discharge status: Home / Self Care | Payer: Medicare Other | Source: Ambulatory Visit | Attending: Radiation Oncology | Admitting: Radiation Oncology

## 2017-09-11 DIAGNOSIS — C61 Malignant neoplasm of prostate: Secondary | ICD-10-CM | POA: Diagnosis not present

## 2017-09-12 ENCOUNTER — Ambulatory Visit
Admission: RE | Admit: 2017-09-12 | Discharge: 2017-09-12 | Disposition: A | Discharge status: Home / Self Care | Payer: Medicare Other | Source: Ambulatory Visit | Attending: Radiation Oncology | Admitting: Radiation Oncology

## 2017-09-12 DIAGNOSIS — C61 Malignant neoplasm of prostate: Secondary | ICD-10-CM | POA: Diagnosis not present

## 2017-09-15 ENCOUNTER — Encounter: Payer: Self-pay | Admitting: Radiation Oncology

## 2017-09-15 NOTE — Progress Notes (Signed)
  Radiation Oncology         [phone] ________________________________  Name: Mark Cline MRN: [id number]  Date: 09/15/2017  DOB: [birth year]  3D Planning Note   Prostate Brachytherapy Post-Implant Dosimetry  Diagnosis: 72 y.o. 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 He underwent CT scan complex simulation to delineate the three-dimensional structures of the pelvis and demonstrate the radiation distribution.  Since that time, the seed localization, and complex isodose planning with dose volume histograms have now been completed.  Results:   Prostate Coverage - The dose of radiation delivered to the 90% or more of the prostate gland (D90) was 102.57% of the prescription dose. This exceeds our goal of greater than 90%. Rectal Sparing - The volume of rectal tissue receiving the prescription dose or higher was 0.0 cc. This falls under our thresholds tolerance of 1.0 cc.  Impression: The prostate seed implant appears to show adequate target coverage and appropriate rectal sparing.  Plan:  The patient will continue to follow with urology for ongoing PSA determinations. I would anticipate a high likelihood for local tumor control with minimal risk for rectal morbidity.  ________________________________  Sheral Apley Tammi Klippel, M.D.

## 2017-09-16 ENCOUNTER — Other Ambulatory Visit: Payer: Self-pay | Admitting: Urology

## 2017-09-16 ENCOUNTER — Ambulatory Visit
Admission: RE | Admit: 2017-09-16 | Discharge: 2017-09-16 | Disposition: A | Discharge status: Home / Self Care | Payer: Medicare Other | Source: Ambulatory Visit | Attending: Radiation Oncology | Admitting: Radiation Oncology

## 2017-09-16 ENCOUNTER — Encounter: Payer: Self-pay | Admitting: Urology

## 2017-09-16 DIAGNOSIS — C61 Malignant neoplasm of prostate: Secondary | ICD-10-CM | POA: Insufficient documentation

## 2017-09-16 DIAGNOSIS — Z510 Encounter for antineoplastic radiation therapy: Secondary | ICD-10-CM | POA: Insufficient documentation

## 2017-09-16 MED ORDER — DOXYCYCLINE HYCLATE 100 MG PO TABS: 100.0000 mg | ORAL_TABLET | Freq: Two times a day (BID) | ORAL | 0 refills | Status: DC

## 2017-09-16 NOTE — Progress Notes (Signed)
Patient was evaluated in clinic today following his regular scheduled prostate IMRT treatment 18 of 25.  He was complaining of right testicle pain and swelling which began acutely on Sat. 09/13/17 and has persisted.  He denies recent injury and has not had recent fever, chills or significant dysuria.  The pain/swelling is unchanged with activity or prolonged standing but does seem to improve with rest.  His pain is aggravated by palpation.  He has not tried taking any OTC medications for pain relief at this point.  On exam, he appears to have an epididymitis with mild erythema and swelling at the base of the right testicle and exquisitely tender to palpation over the epididymal head. No penile discharge noted and otherwise, normal exam. Patient is advised to use good scrotal support with snug fitting, brief style underwear and/or jock strap, use of a rolled hand-towel under the scrotum for elevation when resting, ice and ibuprofen prn for pain relief.  I have sent a Rx for doxycycline 100mg  po BID to his pharmacy which he will pick up and start this evening.  We have arranged for him to be seen at [facility] urology on Thursday 09/18/17 at 8am (earliest appointment available) which he will keep in case his symptoms do not improve with the recommended treatment above.  Otherwise, if his symptoms are much improved, he will call and cancel the appointment and just follow up prn once he completes the prescribed antibiotics.  He appears to have a good understanding of these recommendations and is in agreement with and comfortable with the stated plan.   Nicholos Johns, PA-C

## 2017-09-16 NOTE — Progress Notes (Signed)
Received patient in the nursing clinic following 15/25 intended prostate treatments. Patient reports on Saturday, August 31, his right testicle was swollen to the size of his fist. Reports the size of his testicle is unchanged today. Denies pain when sitting but reports throbbing pain with certain movements. Denies dysuria or hematuria. Reports nocturia is unchanged at 0-4. Denies urinary leakage, urgency or incontinence. Reports occasional diarrhea. Informed Ashlyn Bruning, PA-C of these findings. This RN obtained an appointment for this patient at [facility] in [place] on 09/18/2017 at 0800. Informed patient of appointment date and time. He verbalized understanding in front of Ashlyn Bruning, PA-C.

## 2017-09-17 ENCOUNTER — Ambulatory Visit
Admission: RE | Admit: 2017-09-17 | Discharge: 2017-09-17 | Disposition: A | Discharge status: Home / Self Care | Payer: Medicare Other | Source: Ambulatory Visit | Attending: Radiation Oncology | Admitting: Radiation Oncology

## 2017-09-17 DIAGNOSIS — Z510 Encounter for antineoplastic radiation therapy: Secondary | ICD-10-CM | POA: Diagnosis not present

## 2017-09-18 ENCOUNTER — Ambulatory Visit
Admission: RE | Admit: 2017-09-18 | Discharge: 2017-09-18 | Disposition: A | Discharge status: Home / Self Care | Payer: Medicare Other | Source: Ambulatory Visit | Attending: Radiation Oncology | Admitting: Radiation Oncology

## 2017-09-18 DIAGNOSIS — Z510 Encounter for antineoplastic radiation therapy: Secondary | ICD-10-CM | POA: Diagnosis not present

## 2017-09-19 ENCOUNTER — Ambulatory Visit
Admission: RE | Admit: 2017-09-19 | Discharge: 2017-09-19 | Disposition: A | Discharge status: Home / Self Care | Payer: Medicare Other | Source: Ambulatory Visit | Attending: Radiation Oncology | Admitting: Radiation Oncology

## 2017-09-19 DIAGNOSIS — Z510 Encounter for antineoplastic radiation therapy: Secondary | ICD-10-CM | POA: Diagnosis not present

## 2017-09-22 ENCOUNTER — Ambulatory Visit
Admission: RE | Admit: 2017-09-22 | Discharge: 2017-09-22 | Disposition: A | Discharge status: Home / Self Care | Payer: Medicare Other | Source: Ambulatory Visit | Attending: Radiation Oncology | Admitting: Radiation Oncology

## 2017-09-22 DIAGNOSIS — Z510 Encounter for antineoplastic radiation therapy: Secondary | ICD-10-CM | POA: Diagnosis not present

## 2017-09-23 ENCOUNTER — Ambulatory Visit
Admission: RE | Admit: 2017-09-23 | Discharge: 2017-09-23 | Disposition: A | Discharge status: Home / Self Care | Payer: Medicare Other | Source: Ambulatory Visit | Attending: Radiation Oncology | Admitting: Radiation Oncology

## 2017-09-23 DIAGNOSIS — Z510 Encounter for antineoplastic radiation therapy: Secondary | ICD-10-CM | POA: Diagnosis not present

## 2017-09-24 ENCOUNTER — Ambulatory Visit
Admission: RE | Admit: 2017-09-24 | Discharge: 2017-09-24 | Disposition: A | Discharge status: Home / Self Care | Payer: Medicare Other | Source: Ambulatory Visit | Attending: Radiation Oncology | Admitting: Radiation Oncology

## 2017-09-24 DIAGNOSIS — Z510 Encounter for antineoplastic radiation therapy: Secondary | ICD-10-CM | POA: Diagnosis not present

## 2017-09-25 ENCOUNTER — Ambulatory Visit
Admission: RE | Admit: 2017-09-25 | Discharge: 2017-09-25 | Disposition: A | Discharge status: Home / Self Care | Payer: Medicare Other | Source: Ambulatory Visit | Attending: Radiation Oncology | Admitting: Radiation Oncology

## 2017-09-25 DIAGNOSIS — Z510 Encounter for antineoplastic radiation therapy: Secondary | ICD-10-CM | POA: Diagnosis not present

## 2017-09-26 ENCOUNTER — Ambulatory Visit
Admission: RE | Admit: 2017-09-26 | Discharge: 2017-09-26 | Disposition: A | Discharge status: Home / Self Care | Payer: Medicare Other | Source: Ambulatory Visit | Attending: Radiation Oncology | Admitting: Radiation Oncology

## 2017-09-26 ENCOUNTER — Encounter: Payer: Self-pay | Admitting: Medical Oncology

## 2017-09-26 DIAGNOSIS — Z510 Encounter for antineoplastic radiation therapy: Secondary | ICD-10-CM | POA: Diagnosis not present

## 2017-09-29 ENCOUNTER — Ambulatory Visit
Admission: RE | Admit: 2017-09-29 | Discharge: 2017-09-29 | Disposition: A | Discharge status: Home / Self Care | Payer: Medicare Other | Source: Ambulatory Visit | Attending: Radiation Oncology | Admitting: Radiation Oncology

## 2017-09-29 DIAGNOSIS — Z510 Encounter for antineoplastic radiation therapy: Secondary | ICD-10-CM | POA: Diagnosis not present

## 2017-09-30 ENCOUNTER — Encounter: Payer: Self-pay | Admitting: Medical Oncology

## 2017-09-30 ENCOUNTER — Ambulatory Visit
Admission: RE | Admit: 2017-09-30 | Discharge: 2017-09-30 | Disposition: A | Discharge status: Home / Self Care | Payer: Medicare Other | Source: Ambulatory Visit | Attending: Radiation Oncology | Admitting: Radiation Oncology

## 2017-09-30 ENCOUNTER — Encounter: Payer: Self-pay | Admitting: Radiation Oncology

## 2017-09-30 DIAGNOSIS — Z510 Encounter for antineoplastic radiation therapy: Secondary | ICD-10-CM | POA: Diagnosis not present

## 2017-10-01 ENCOUNTER — Ambulatory Visit: Payer: Medicare Other

## 2017-10-02 ENCOUNTER — Ambulatory Visit: Payer: Medicare Other

## 2017-10-02 NOTE — Progress Notes (Signed)
°  Radiation Oncology         [phone]) [phone] ________________________________  Name: Mark Cline MRN: [id number]  Date: 09/30/2017  DOB: 1945-03-20  End of Treatment Note  Diagnosis:   72 y.o. male with stage T2badenocarcinoma of the prostate with a Gleason's score of 4+5and a PSA of 8.8    Indication for treatment:  Curative, Definitive Radiotherapy       Radiation treatment dates:   08/26/2017 - 09/30/2017  Site/dose:   The prostate was treated to 45 Gy in 25 fractions of 1.8 Gy, to supplement an up-front prostate seed implant boost of 110 Gy to achieve a total nominal dose of 155 Gy.  Beams/energy:   The patient was treated with IMRT using volumetric arc therapy delivering 6 MV X-rays to clockwise and counterclockwise circumferential arcs with a 90 degree collimator offset to avoid dose scalloping.  Image guidance was performed with daily cone beam CT prior to each fraction to align to gold markers in the prostate and assure proper bladder and rectal fill volumes.  Immobilization was achieved with BodyFix custom mold.  Narrative: The patient tolerated radiation treatment relatively well.   He experienced modest fatigue and some minor urinary irritation with urgency and nocturia x3-4. He developed epididymitis during the course of treatment that was resolved with antibiotics. He also noted mild, infrequent episodes of diarrhea.   Plan: The patient has completed radiation treatment. He will return to radiation oncology clinic for routine followup in one month. I advised him to call or return sooner if he has any questions or concerns related to his recovery or treatment. ________________________________  Sheral Apley. Tammi Klippel, M.D.  This document serves as a record of services personally performed by Tyler Pita, MD. It was created on his behalf by Rae Lips, a trained medical scribe. The creation of this record is based on the scribe's personal observations and the provider's  statements to them. This document has been checked and approved by the attending provider.

## 2017-10-03 ENCOUNTER — Ambulatory Visit: Payer: Medicare Other

## 2017-10-06 ENCOUNTER — Ambulatory Visit: Payer: Medicare Other

## 2017-10-07 ENCOUNTER — Ambulatory Visit: Payer: Medicare Other

## 2017-10-08 ENCOUNTER — Ambulatory Visit: Payer: Medicare Other

## 2017-10-09 ENCOUNTER — Ambulatory Visit: Payer: Medicare Other

## 2017-10-10 ENCOUNTER — Ambulatory Visit: Payer: Medicare Other

## 2017-10-13 ENCOUNTER — Ambulatory Visit: Payer: Medicare Other

## 2017-10-14 ENCOUNTER — Ambulatory Visit: Payer: Medicare Other

## 2017-10-15 ENCOUNTER — Ambulatory Visit: Payer: Medicare Other

## 2017-10-16 ENCOUNTER — Ambulatory Visit: Payer: Medicare Other

## 2017-10-17 ENCOUNTER — Ambulatory Visit: Payer: Medicare Other

## 2017-10-20 ENCOUNTER — Ambulatory Visit: Payer: Medicare Other

## 2017-10-21 ENCOUNTER — Ambulatory Visit: Payer: Medicare Other

## 2017-10-22 ENCOUNTER — Other Ambulatory Visit: Payer: Self-pay

## 2017-10-22 ENCOUNTER — Ambulatory Visit
Admission: RE | Admit: 2017-10-22 | Discharge: 2017-10-22 | Disposition: A | Discharge status: Home / Self Care | Payer: Medicare Other | Source: Ambulatory Visit | Attending: Urology | Admitting: Urology

## 2017-10-22 VITALS — BP 149/83 | HR 89 | Temp 97.9°F | Resp 20 | Ht 70.0 in | Wt 185.8 lb

## 2017-10-22 DIAGNOSIS — Z79899 Other long term (current) drug therapy: Secondary | ICD-10-CM | POA: Diagnosis not present

## 2017-10-22 DIAGNOSIS — C61 Malignant neoplasm of prostate: Secondary | ICD-10-CM | POA: Insufficient documentation

## 2017-10-22 DIAGNOSIS — Z923 Personal history of irradiation: Secondary | ICD-10-CM | POA: Diagnosis not present

## 2017-10-22 NOTE — Progress Notes (Signed)
Radiation Oncology         [phone] ________________________________  Name: THIERNO HUN MRN: [id number]  Date: 10/22/2017  DOB: 15-Jan-1946  Post Treatment Note  CC: [facility], [facility]  Raynelle Bring, MD  Diagnosis:   72 y.o. male with stage T2badenocarcinoma of the prostate with a Gleason's score of 4+5and a PSA of 8.8   Interval Since Last Radiation:  3 weeks  08/26/2017 - 09/30/2017:  The prostate was treated to 45 Gy in 25 fractions of 1.8 Gy, to supplement an up-front prostate seed implant boost of 110 Gy performed on 07/31/17, to achieve a total nominal dose of 155 Gy.  Narrative:  The patient returns today for routine follow-up.  He tolerated radiation treatment relatively well.   He experienced modest fatigue and some minor urinary irritation with urgency and nocturia x3-4. He developed epididymitis during the course of treatment that was resolved with antibiotics. He also noted mild, infrequent episodes of diarrhea.   He was maintained on ADT throughout his course of treatment.                             On review of systems, the patient states that he is doing well overall.  He continues with moderate fatigue that is gradually improving as well as loose bowel movement once per morning but not further throughout the day.  He continues with increased daytime frequency, urgency and nocturia x3 but reports a good strong stream and feels that he empties his bladder well on voiding.  He specifically denies dysuria, gross hematuria, fever or chills.  He reports a healthy appetite and is maintaining his weight.  He denies abdominal pain, nausea, vomiting, diarrhea or constipation.  He continues to tolerate the ADT fairly well despite fatigue and occasional hot flashes.  ALLERGIES:  has No Known Allergies.  Meds: Current Outpatient Medications  Medication Sig Dispense Refill  . ALBUTEROL IN Inhale into the lungs as needed.    . doxycycline (VIBRA-TABS) 100 MG tablet Take 1  tablet (100 mg total) by mouth 2 (two) times daily. 28 tablet 0  . lisinopril (PRINIVIL,ZESTRIL) 10 MG tablet Take 10 mg by mouth every morning.     . traMADol (ULTRAM) 50 MG tablet Take 1 tablet (50 mg total) by mouth every 6 (six) hours as needed. 6 tablet 0  . TRELEGY ELLIPTA 100-62.5-25 MCG/INH AEPB Inhale 1 puff into the lungs every morning.      No current facility-administered medications for this encounter.     Physical Findings:  height is 5\' 10"  (1.778 m) and weight is 185 lb 12.8 oz (84.3 kg). His oral temperature is 97.9 F (36.6 C). His blood pressure is 149/83 (abnormal) and his pulse is 89. His respiration is 20 and oxygen saturation is 97%.  Pain Assessment Pain Score: 0-No pain/10 In general this is a well appearing Caucasian male in no acute distress.  He's alert and oriented x4 and appropriate throughout the examination. Cardiopulmonary assessment is negative for acute distress and he exhibits normal effort.   Lab Findings: Lab Results  Component Value Date   WBC 8.5 07/24/2017   HGB 17.0 07/24/2017   HCT 48.9 07/24/2017   MCV 90.9 07/24/2017   PLT 206 07/24/2017     Radiographic Findings: No results found.  Impression/Plan: 1. 72 y.o. male with stage T2badenocarcinoma of the prostate with a Gleason's score of 4+5and a PSA of 8.8. He will continue to  follow up with urology for ongoing PSA determinations and has an appointment scheduled with Dr. Jeffie Pollock on 12/05/17.  He anticipates completing a full 2-year course of ADT.  He understands what to expect with regards to PSA monitoring going forward. I will look forward to following his response to treatment via correspondence with urology, and would be happy to continue to participate in his care if clinically indicated. I talked to the patient about what to expect in the future, including his risk for erectile dysfunction and rectal bleeding. I encouraged him to call or return to the office if he has any questions  regarding his previous radiation or possible radiation side effects. He was comfortable with this plan and will follow up as needed.    Nicholos Johns, PA-C

## 2017-11-18 ENCOUNTER — Other Ambulatory Visit: Payer: Self-pay | Admitting: Urology

## 2017-11-18 DIAGNOSIS — R911 Solitary pulmonary nodule: Secondary | ICD-10-CM

## 2017-12-05 ENCOUNTER — Ambulatory Visit: Payer: Medicare Other | Admitting: Urology

## 2017-12-05 DIAGNOSIS — C61 Malignant neoplasm of prostate: Secondary | ICD-10-CM | POA: Diagnosis not present

## 2017-12-05 DIAGNOSIS — R3915 Urgency of urination: Secondary | ICD-10-CM

## 2017-12-09 ENCOUNTER — Ambulatory Visit (INDEPENDENT_AMBULATORY_CARE_PROVIDER_SITE_OTHER): Payer: Medicare Other | Admitting: Urology

## 2017-12-09 ENCOUNTER — Ambulatory Visit (HOSPITAL_COMMUNITY)
Admission: RE | Admit: 2017-12-09 | Discharge: 2017-12-09 | Disposition: A | Discharge status: Home / Self Care | Payer: Medicare Other | Source: Ambulatory Visit | Attending: Urology | Admitting: Urology

## 2017-12-09 DIAGNOSIS — R911 Solitary pulmonary nodule: Secondary | ICD-10-CM | POA: Insufficient documentation

## 2017-12-09 DIAGNOSIS — C61 Malignant neoplasm of prostate: Secondary | ICD-10-CM

## 2017-12-09 LAB — POCT I-STAT CREATININE: Creatinine, Ser: 0.9 mg/dL (ref 0.61–1.24)

## 2017-12-09 MED ORDER — IOHEXOL 300 MG/ML  SOLN
75.0000 mL | Freq: Once | INTRAMUSCULAR | Status: AC | PRN
  Administered 2017-12-09: 75 mL via INTRAVENOUS

## 2018-06-12 ENCOUNTER — Ambulatory Visit (INDEPENDENT_AMBULATORY_CARE_PROVIDER_SITE_OTHER): Payer: Medicare Other | Admitting: Urology

## 2018-06-12 DIAGNOSIS — C61 Malignant neoplasm of prostate: Secondary | ICD-10-CM | POA: Diagnosis not present

## 2018-06-12 DIAGNOSIS — R3915 Urgency of urination: Secondary | ICD-10-CM | POA: Diagnosis not present

## 2018-09-10 ENCOUNTER — Other Ambulatory Visit: Payer: Self-pay

## 2018-09-10 ENCOUNTER — Encounter: Payer: Self-pay | Admitting: Pulmonary Disease

## 2018-09-10 ENCOUNTER — Ambulatory Visit (INDEPENDENT_AMBULATORY_CARE_PROVIDER_SITE_OTHER): Payer: Medicare Other | Admitting: Pulmonary Disease

## 2018-09-10 VITALS — BP 110/72 | HR 85 | Temp 98.2°F | Ht 70.0 in | Wt 184.0 lb

## 2018-09-10 DIAGNOSIS — J9611 Chronic respiratory failure with hypoxia: Secondary | ICD-10-CM | POA: Diagnosis not present

## 2018-09-10 DIAGNOSIS — J449 Chronic obstructive pulmonary disease, unspecified: Secondary | ICD-10-CM | POA: Diagnosis not present

## 2018-09-10 MED ORDER — BUDESONIDE-FORMOTEROL FUMARATE 160-4.5 MCG/ACT IN AERO: 2.0000 | INHALATION_SPRAY | Freq: Two times a day (BID) | RESPIRATORY_TRACT | 6 refills | Status: DC

## 2018-09-10 NOTE — Patient Instructions (Signed)
Stop trelegy  Symbicort two puffs in the morning, and two puffs at night; rinse mouth after each use  Albuterol two puffs every 6 hours as needed for cough, wheeze, or chest congestion  Your goal oxygen level is above 90%  Will arrange for overnight oxygen test at home  Lab test today  Will arrange for pulmonary rehab referral at [facility]  Will get copy of your breathing test from Dr. Trena Platt office  Follow up in 6 weeks

## 2018-09-10 NOTE — Progress Notes (Signed)
[facility]  Chief Complaint  Patient presents with  . Consult    Consult for COPD. He reports being put on oxygen about 3 weeks ago. He reports SOB with exertion.Currently using trelefgy daily. Using 2L of oxygen.     Constitutional:  BP 110/72   Pulse 85   Temp 98.2 F (36.8 C) (Temporal)   Ht 5\' 10"  (1.778 m)   Wt 184 lb (83.5 kg)   SpO2 99%   BMI 26.40 kg/m   Past Medical History:  HTN, OA, AAA, Prostate cancer  Brief Summary:  Mark Cline is a 73 y.o. male former smoker with COPD/emphysema and chronic hypoxic respiratory failure.  Has extensive history of smoking.  Quit two years ago.  Multiple family members died from lung cancer and emphysema.  He has trouble getting short of breath with activity.  As a result he is not very active.  Gets cough with clear sputum.  Has occasional wheezing.  Sleeps okay.  Not having chest pain or leg swelling.  Started on 2 liters oxygen few weeks ago.  He hasn't been using consistently.    From [place], but has lived in [place] for decades.  [place]  No animal/bird exposures.  No history of pneumonia or TB.  Physical Exam:   Appearance - well kempt   ENMT - clear nasal mucosa, midline nasal  septum, no oral exudates, no LAN, trachea midline  Respiratory - normal chest wall, normal respiratory effort, no accessory muscle use, no wheeze/rales  CV - s1s2 regular rate and rhythm, no murmurs, no peripheral edema, radial pulses symmetric  GI - soft, non tender, no masses  Lymph - no adenopathy noted in neck and axillary areas  MSK - normal gait  Ext - no cyanosis, clubbing, or joint inflammation noted  Skin - no rashes, lesions, or ulcers  Neuro - normal strength, oriented x 3  Psych - normal mood and affect   Assessment/Plan:   COPD with emphysema and chronic bronchitis. - doesn't feel trelegy is effective and is now cost prohibitive - will change to symbicort with prn  albuterol - arrange for pulmonary rehab - check alpha 1 antitrypsin level - he will get flu shot with PCP - will get copy of his PFT from PCP office  Chronic respiratory failure with hypoxia. - discussed role of supplemental oxygen - goal SpO2 > 90% - continue 2 liters oxygen with exertion - will arrange for ONO with RA to determine if he needs to use oxygen at night   Patient Instructions  Stop trelegy  Symbicort two puffs in the morning, and two puffs at night; rinse mouth after each use  Albuterol two puffs every 6 hours as needed for cough, wheeze, or chest congestion  Your goal oxygen level is above 90%  Will arrange for overnight oxygen test at home  Lab test today  Will arrange for pulmonary rehab referral at [facility]  Will get copy of your breathing test from Dr. Trena Platt office  Follow up in 6 weeks    [name], MD [place] Pager: [phone] 09/10/2018, 12:14 PM  Flow Sheet     Pulmonary tests:    Chest imaging:  CT chest 07/15/17 >> atherosclerosis, calcified Rt hilar/paratracheal/subcarinal LN, advanced changes of emphysema, bronchial wall thickening, 1.5 cm density RUL, calcified granuloma RML and liver/spleen CT chest 12/09/17 >> stable RUL density, new patchy ASD lower lobes Lt > Rt  Review of Systems:  Denies headache, fever, eye pain, ear pain, gland swelling, thyroid swelling, chest pain, palpitations, abdominal pain, heartburn, diarrhea, skin rash, leg swelling, difficulty walking, muscle weakness.   Medications:   Allergies as of 09/10/2018   No Known Allergies     Medication List       Accurate as of September 10, 2018 12:14 PM. If you have any questions, ask your nurse or doctor.        STOP taking these medications   doxycycline 100 MG tablet Commonly known as: VIBRA-TABS Stopped by: [name], MD   Trelegy Ellipta 100-62.5-25 MCG/INH Aepb Generic drug: Fluticasone-Umeclidin-Vilant Stopped  by: [name], MD     TAKE these medications   ALBUTEROL IN Inhale into the lungs as needed.   budesonide-formoterol 160-4.5 MCG/ACT inhaler Commonly known as: Symbicort Inhale 2 puffs into the lungs 2 (two) times daily. Started by: [name], MD   lisinopril 10 MG tablet Commonly known as: ZESTRIL Take 10 mg by mouth every morning.       Past Surgical History:  He  has a past surgical history that includes Prostate biopsy (02-28-2017   dr Jeffie Pollock office); Appendectomy (1960s); Tonsillectomy (age 41); Laparoscopic cholecystectomy (1992); Radioactive seed implant (N/A, 07/31/2017); and SPACE OAR INSTILLATION (N/A, 07/31/2017).  Family History:  His family history includes Cancer (age of onset: 70) in his father.  Social History:  He  reports that he quit smoking about 2 years ago. His smoking use included cigarettes. He has a 58.00 pack-year smoking history. He has never used smokeless tobacco. He reports that he does not drink alcohol or use drugs.

## 2018-09-14 ENCOUNTER — Encounter: Payer: Self-pay | Admitting: Pulmonary Disease

## 2018-09-15 LAB — ALPHA-1 ANTITRYPSIN PHENOTYPE: A-1 Antitrypsin, Ser: 139 mg/dL (ref 83–199)

## 2018-09-16 ENCOUNTER — Telehealth: Payer: Self-pay | Admitting: Pulmonary Disease

## 2018-09-16 NOTE — Telephone Encounter (Signed)
Pt notified normal labs no inherited emphysema form. Nothing further needed.

## 2018-09-16 NOTE — Telephone Encounter (Signed)
A1AT 09/10/18 >> 139, MM    Please let him know his lab test was normal.  He does not have inherited form of emphysema.

## 2018-09-18 ENCOUNTER — Telehealth: Payer: Self-pay | Admitting: Pulmonary Disease

## 2018-09-18 NOTE — Telephone Encounter (Signed)
Diane from [facility] states the patient will need a PFT before he can do pulm rehab. She said this only applies to COPD dx and especially medicare. She is going to look into his insurance and check other resource and get back with me. Will hold message until Shauna Hugh returns the call.

## 2018-09-18 NOTE — Telephone Encounter (Signed)
Mark Cline returned phone call, this patient will not need a PFT. She states medicare A&B for COPD will need PFT for future references. Voiced understanding. Nothing further needed at this time.

## 2018-09-23 ENCOUNTER — Telehealth: Payer: Self-pay | Admitting: Pulmonary Disease

## 2018-09-23 NOTE — Telephone Encounter (Signed)
ONO results received via fax.  Results given to Dr Halford Chessman today during clinic.

## 2018-09-24 ENCOUNTER — Telehealth: Payer: Self-pay | Admitting: Pulmonary Disease

## 2018-09-24 DIAGNOSIS — J9611 Chronic respiratory failure with hypoxia: Secondary | ICD-10-CM

## 2018-09-24 NOTE — Telephone Encounter (Signed)
Called and spoke with Patient.  Dr Halford Chessman ONO results and recommended.  Understanding stated.   DME [employer] confirmed with Patient.  DME order placed. Nothing further at this time.

## 2018-09-24 NOTE — Telephone Encounter (Signed)
ONO with RA 09/14/18 >> test time 13 hrs 58 min.  Baseline SpO2 91%, low SpO2 77%.  Spent 2 hrs 7 min with SpO2 < 88%.   Please let him know his oxygen level is low at night while asleep.  He needs to use 2 liters oxygen at night in addition to using 2 liters oxygen during the day with exertion.  Please send an order to his DME to make sure he has appropriate set up.

## 2018-09-25 ENCOUNTER — Telehealth: Payer: Self-pay | Admitting: Pulmonary Disease

## 2018-09-25 DIAGNOSIS — J9611 Chronic respiratory failure with hypoxia: Secondary | ICD-10-CM

## 2018-09-25 NOTE — Telephone Encounter (Signed)
Called and spoke with Levada Dy from [place]. Levada Dy states she wanted to confirm with [place] that pt is already on O2 2L continuous. I let her know of the ONO and that [facility] wanted pt to start using O2 2L at bedtime and with exertion. Levada Dy verbalized understanding with no additional questions and stated she would let the pt know. Nothing further needed at this time.

## 2018-09-25 NOTE — Telephone Encounter (Signed)
Call returned to patient, he states he would like to switch DME companies. He states he has been with [facility] for about 1 month. I made him aware I am not sure if insurance will allow him to switch but we could try. Voiced understanding. Order placed. Nothing further needed at this time.

## 2018-09-28 ENCOUNTER — Telehealth: Payer: Self-pay | Admitting: Pulmonary Disease

## 2018-09-28 NOTE — Telephone Encounter (Signed)
Called [employer] and spoke to [place], who transferred me to [place] but I was on hold for 8 minutes with no answer.  Wcb.

## 2018-09-29 NOTE — Telephone Encounter (Signed)
Called and spoke to [place] with [employer] and was advised the pt doesn't have exertional sats to qualify him for a POC. Called and spoke to pt. Advised him he would need to come into the office for a walk test to get the needed information to order a POC. Pt states he already has the portable tanks and will just wait until his appt with [facility] on 10/29/2018 to do the walk test during the OV. Nothing further needed at this time.

## 2018-10-14 ENCOUNTER — Encounter (HOSPITAL_COMMUNITY): Payer: Self-pay

## 2018-10-14 ENCOUNTER — Other Ambulatory Visit: Payer: Self-pay

## 2018-10-14 ENCOUNTER — Encounter (HOSPITAL_COMMUNITY)
Admission: RE | Admit: 2018-10-14 | Discharge: 2018-10-14 | Disposition: A | Discharge status: Home / Self Care | Payer: Medicare Other | Source: Ambulatory Visit | Attending: Pulmonary Disease | Admitting: Pulmonary Disease

## 2018-10-14 VITALS — BP 100/64 | HR 100 | Temp 98.9°F | Ht 70.0 in | Wt 183.1 lb

## 2018-10-14 DIAGNOSIS — J449 Chronic obstructive pulmonary disease, unspecified: Secondary | ICD-10-CM | POA: Diagnosis not present

## 2018-10-14 NOTE — Progress Notes (Signed)
Cardiac/Pulmonary Rehab Medication Review by a Pharmacist  Does the patient  feel that his/her medications are working for him/her?  Yes- can tell it gets worse if he forgets to use it  Has the patient been experiencing any side effects to the medications prescribed?  no  Does the patient measure his/her own blood pressure or blood glucose at home?  yes   Does the patient have any problems obtaining medications due to transportation or finances?   no  Understanding of regimen: good Understanding of indications: good Potential of compliance: good  Questions asked to Determine Patient Understanding of Medication Regimen:  1. What is the name of the medication?  2. What is the medication used for?  3. When should it be taken?  4. How much should be taken?  5. How will you take it?  6. What side effects should you report?  Understanding Defined as: Excellent: All questions above are correct Good: Questions 1-4 are correct Fair: Questions 1-2 are correct  Poor: 1 or none of the above questions are correct   Pharmacist comments: Overall, patient only has two meds he takes and PRN albuterol that he says he does not take frequently.  He has no further questions on his medications.  Margot Ables, PharmD Clinical Pharmacist 10/14/2018 1:58 PM

## 2018-10-14 NOTE — Progress Notes (Signed)
Daily Session Note  Patient Details  Name: Mark Cline MRN: [id number] Date of Birth: [birth year] Referring Provider:     PULMONARY REHAB COPD ORIENTATION from 10/14/2018 in [place]  Referring Provider  [place]      Encounter Date: 10/14/2018  Check In: Session Check In - 10/14/18 1432      Check-In   Supervising physician immediately available to respond to emergencies  See telemetry face sheet for immediately available MD    Location  [facility]    Staff Present  Russella Dar, MS, EP, [facility], Exercise Physiologist;Debra Wynetta Emery, RN, Cory Munch, Exercise Physiologist    Virtual Visit  No    Medication changes reported      No    Fall or balance concerns reported     No    Tobacco Cessation  --   Quit 2018   Warm-up and Cool-down  Performed as group-led instruction    Resistance Training Performed  Yes    VAD Patient?  No    PAD/SET Patient?  No      Pain Assessment   Currently in Pain?  No/denies    Pain Score  0-No pain    Multiple Pain Sites  No       Capillary Blood Glucose: No results found for this or any previous visit (from the past 24 hour(s)).  Exercise Prescription Changes - 10/14/18 1300      Response to Exercise   Blood Pressure (Admit)  100/64    Blood Pressure (Exercise)  120/68    Blood Pressure (Exit)  108/64    Heart Rate (Admit)  100 bpm    Heart Rate (Exercise)  125 bpm    Heart Rate (Exit)  110 bpm    Oxygen Saturation (Admit)  96 %    Oxygen Saturation (Exercise)  94 %    Oxygen Saturation (Exit)  96 %    Rating of Perceived Exertion (Exercise)  17    Perceived Dyspnea (Exercise)  16    Symptoms  SOB    Comments  6MWT    Duration  Progress to 30 minutes of  aerobic without signs/symptoms of physical distress    Intensity  THRR New   [phone]      Social History   Tobacco Use  Smoking Status Former Smoker  . Packs/day: 0.50  . Years: 60.00  . Pack years: 30.00  . Types: Cigarettes   . Quit date: 07/28/2016  . Years since quitting: 2.2  Smokeless Tobacco Never Used    Goals Met:  Independence with exercise equipment. Able to do the walk test. Prompted to do pursed lip breathing during walk test. Had no heart issues or any other abnormal S/S.   Goals Unmet:  Not Applicable  Comments: Check out: 42   Dr. Sinda Du is Medical Director for [facility].

## 2018-10-14 NOTE — Progress Notes (Signed)
Pulmonary Individual Treatment Plan  Patient Details  Name: Mark Cline MRN: [id number] Date of Birth: 10/03/1945 Referring Provider:     PULMONARY REHAB COPD ORIENTATION from 10/14/2018 in [place]  Referring Provider  Sood      Initial Encounter Date:    PULMONARY REHAB COPD ORIENTATION from 10/14/2018 in [place]  Date  10/14/18      Visit Diagnosis: COPD with chronic bronchitis and emphysema ([place])  Patient's Home Medications on Admission:   Current Outpatient Medications:  .  ALBUTEROL IN, Inhale into the lungs as needed., Disp: , Rfl:  .  budesonide-formoterol (SYMBICORT) 160-4.5 MCG/ACT inhaler, Inhale 2 puffs into the lungs 2 (two) times daily., Disp: 1 Inhaler, Rfl: 6 .  lisinopril (PRINIVIL,ZESTRIL) 10 MG tablet, Take 10 mg by mouth every morning. , Disp: , Rfl:   Past Medical History: Past Medical History:  Diagnosis Date  . AAA (abdominal aortic aneurysm) ([place]) followed by pcp   infarenal , per CT 03-17-2017 , 4.4cm  . Arthritis    hands  . COPD with emphysema ([place])    followed by pcp  . Dyspnea on exertion   . History of amputation of finger age 60  . Hypertension   . Prostate cancer [facility]) urologist-  dr wrenn/  onologist-- dr Tammi Klippel   dx 02-28-2017-- STage T2b, Gleason 4+5, PSA 8.8-- scheduled for radiactive seed implants 07-31-2017 then IMRT  . Wears glasses   . Wears partial dentures    upper    Tobacco Use: Social History   Tobacco Use  Smoking Status Former Smoker  . Packs/day: 0.50  . Years: 60.00  . Pack years: 30.00  . Types: Cigarettes  . Quit date: 07/28/2016  . Years since quitting: 2.2  Smokeless Tobacco Never Used    Labs: Recent Review [facility] for [facility] Latest Ref Rng & Units 01/14/2014   TCO2 0 - 100 mmol/L 25      Capillary Blood Glucose: No results found for: GLUCAP   Pulmonary Assessment Scores: Pulmonary Assessment Scores     Row Name 10/14/18 1445         ADL UCSD   ADL Phase  Entry     SOB Score total  100     Rest  0     Walk  13     Stairs  5     Bath  5     Dress  5     Shop  5       CAT Score   CAT Score  20       mMRC Score   mMRC Score  3       UCSD: Self-administered rating of dyspnea associated with activities of daily living (ADLs) 6-point scale (0 = "not at all" to 5 = "maximal or unable to do because of breathlessness")  Scoring Scores range from 0 to 120.  Minimally important difference is 5 units  CAT: CAT can identify the health impairment of COPD patients and is better correlated with disease progression.  CAT has a scoring range of zero to 40. The CAT score is classified into four groups of low (less than 10), medium (10 - 20), high (21-30) and very high (31-40) based on the impact level of disease on health status. A CAT score over 10 suggests significant symptoms.  A worsening CAT score could be explained by an exacerbation, poor medication adherence, poor inhaler  technique, or progression of COPD or comorbid conditions.  CAT MCID is 2 points  mMRC: mMRC (Modified Medical Research Council) Dyspnea Scale is used to assess the degree of baseline functional disability in patients of respiratory disease due to dyspnea. No minimal important difference is established. A decrease in score of 1 point or greater is considered a positive change.   Pulmonary Function Assessment:   Exercise Target Goals: Exercise Program Goal: Individual exercise prescription set using results from initial 6 min walk test and THRR while considering  patient's activity barriers and safety.   Exercise Prescription Goal: Initial exercise prescription builds to 30-45 minutes a day of aerobic activity, 2-3 days per week.  Home exercise guidelines will be given to patient during program as part of exercise prescription that the participant will acknowledge.  Activity Barriers & Risk Stratification: Activity  Barriers & Cardiac Risk Stratification - 10/14/18 1348      Activity Barriers & Cardiac Risk Stratification   Activity Barriers  Shortness of Breath;Deconditioning    Cardiac Risk Stratification  High       6 Minute Walk: 6 Minute Walk    Row Name 10/14/18 1347         6 Minute Walk   Phase  Initial     Distance  400 feet     Walk Time  2.5 minutes     # of Rest Breaks  2     MPH  0.75     METS  1.58     RPE  17     Perceived Dyspnea   16     VO2 Peak  4.83     Symptoms  Yes (comment)     Comments  extreme SOB     Resting HR  100 bpm     Resting BP  100/64     Resting Oxygen Saturation   96 %     Exercise Oxygen Saturation  during 6 min walk  94 %     Max Ex. HR  125 bpm     Max Ex. BP  120/68     2 Minute Post BP  108/64        Oxygen Initial Assessment: Oxygen Initial Assessment - 10/14/18 1442      Home Oxygen   Home Oxygen Device  Home Concentrator;E-Tanks    Sleep Oxygen Prescription  Continuous    Liters per minute  2    Home Exercise Oxygen Prescription  None    Home at Rest Exercise Oxygen Prescription  None    Compliance with Home Oxygen Use  Yes      Initial 6 min Walk   Oxygen Used  Continuous    Liters per minute  6      Program Oxygen Prescription   Program Oxygen Prescription  Continuous    Liters per minute  6      Intervention   Short Term Goals  To learn and understand importance of maintaining oxygen saturations>88%;To learn and demonstrate proper pursed lip breathing techniques or other breathing techniques.;To learn and understand importance of monitoring SPO2 with pulse oximeter and demonstrate accurate use of the pulse oximeter.;To learn and exhibit compliance with exercise, home and travel O2 prescription    Long  Term Goals  Exhibits compliance with exercise, home and travel O2 prescription;Maintenance of O2 saturations>88%;Exhibits proper breathing techniques, such as pursed lip breathing or other method taught during program session        Oxygen Re-Evaluation:  Oxygen Discharge (Final Oxygen Re-Evaluation):   Initial Exercise Prescription: Initial Exercise Prescription - 10/14/18 1300      Date of Initial Exercise RX and Referring Provider   Date  10/14/18    Referring Provider  Sood    Expected Discharge Date  01/13/19      Oxygen   Oxygen  Continuous    Liters  6   with exertion     NuStep   Level  1    SPM  88    Minutes  17    METs  2.1      Arm/Foot Ergometer   Level  1    Watts  12    Minutes  17    METs  1.9      Prescription Details   Frequency (times per week)  3    Duration  Progress to 30 minutes of continuous aerobic without signs/symptoms of physical distress      Intensity   THRR 40-80% of Max Heartrate  [phone]    Ratings of Perceived Exertion  11-13    Perceived Dyspnea  0-4      Progression   Progression  Continue progressive overload as per policy without signs/symptoms or physical distress.      Resistance Training   Training Prescription  Yes    Weight  1    Reps  10-15       Perform Capillary Blood Glucose checks as needed.  Exercise Prescription Changes:  Exercise Prescription Changes    Row Name 10/14/18 1300             Response to Exercise   Blood Pressure (Admit)  100/64       Blood Pressure (Exercise)  120/68       Blood Pressure (Exit)  108/64       Heart Rate (Admit)  100 bpm       Heart Rate (Exercise)  125 bpm       Heart Rate (Exit)  110 bpm       Oxygen Saturation (Admit)  96 %       Oxygen Saturation (Exercise)  94 %       Oxygen Saturation (Exit)  96 %       Rating of Perceived Exertion (Exercise)  17       Perceived Dyspnea (Exercise)  16       Symptoms  SOB       Comments  6MWT       Duration  Progress to 30 minutes of  aerobic without signs/symptoms of physical distress       Intensity  THRR New [phone]          Exercise Comments:   Exercise Goals and Review:  Exercise Goals    Row Name 10/14/18 1350              Exercise Goals   Increase Physical Activity  Yes       Intervention  Provide advice, education, support and counseling about physical activity/exercise needs.;Develop an individualized exercise prescription for aerobic and resistive training based on initial evaluation findings, risk stratification, comorbidities and participant's personal goals.       Expected Outcomes  Short Term: Attend rehab on a regular basis to increase amount of physical activity.;Long Term: Add in home exercise to make exercise part of routine and to increase amount of physical activity.;Long Term: Exercising regularly at least 3-5 days a week.       Increase Strength  and Stamina  Yes       Intervention  Provide advice, education, support and counseling about physical activity/exercise needs.;Develop an individualized exercise prescription for aerobic and resistive training based on initial evaluation findings, risk stratification, comorbidities and participant's personal goals.       Expected Outcomes  Short Term: Increase workloads from initial exercise prescription for resistance, speed, and METs.;Short Term: Perform resistance training exercises routinely during rehab and add in resistance training at home;Long Term: Improve cardiorespiratory fitness, muscular endurance and strength as measured by increased METs and functional capacity (6MWT)       Able to understand and use rate of perceived exertion (RPE) scale  Yes       Intervention  Provide education and explanation on how to use RPE scale       Expected Outcomes  Short Term: Able to use RPE daily in rehab to express subjective intensity level;Long Term:  Able to use RPE to guide intensity level when exercising independently       Able to understand and use Dyspnea scale  Yes       Intervention  Provide education and explanation on how to use Dyspnea scale       Expected Outcomes  Short Term: Able to use Dyspnea scale daily in rehab to express subjective sense  of shortness of breath during exertion;Long Term: Able to use Dyspnea scale to guide intensity level when exercising independently       Knowledge and understanding of Target Heart Rate Range (THRR)  Yes       Intervention  Provide education and explanation of THRR including how the numbers were predicted and where they are located for reference       Expected Outcomes  Short Term: Able to state/look up THRR;Long Term: Able to use THRR to govern intensity when exercising independently;Short Term: Able to use daily as guideline for intensity in rehab       Able to check pulse independently  Yes       Intervention  Provide education and demonstration on how to check pulse in carotid and radial arteries.;Review the importance of being able to check your own pulse for safety during independent exercise       Expected Outcomes  Short Term: Able to explain why pulse checking is important during independent exercise;Long Term: Able to check pulse independently and accurately       Understanding of Exercise Prescription  Yes       Intervention  Provide education, explanation, and written materials on patient's individual exercise prescription       Expected Outcomes  Short Term: Able to explain program exercise prescription;Long Term: Able to explain home exercise prescription to exercise independently          Exercise Goals Re-Evaluation :   Discharge Exercise Prescription (Final Exercise Prescription Changes): Exercise Prescription Changes - 10/14/18 1300      Response to Exercise   Blood Pressure (Admit)  100/64    Blood Pressure (Exercise)  120/68    Blood Pressure (Exit)  108/64    Heart Rate (Admit)  100 bpm    Heart Rate (Exercise)  125 bpm    Heart Rate (Exit)  110 bpm    Oxygen Saturation (Admit)  96 %    Oxygen Saturation (Exercise)  94 %    Oxygen Saturation (Exit)  96 %    Rating of Perceived Exertion (Exercise)  17    Perceived Dyspnea (Exercise)  16  Symptoms  SOB    Comments   6MWT    Duration  Progress to 30 minutes of  aerobic without signs/symptoms of physical distress    Intensity  THRR New   [phone]      Nutrition:  Target Goals: Understanding of nutrition guidelines, daily intake of sodium 1500mg , cholesterol 200mg , calories 30% from fat and 7% or less from saturated fats, daily to have 5 or more servings of fruits and vegetables.  Biometrics: Pre Biometrics - 10/14/18 1355      Pre Biometrics   Height  5\' 10"  (1.778 m)    Waist Circumference  41.5 inches    Hip Circumference  40 inches    Waist to Hip Ratio  1.04 %    Triceps Skinfold  7 mm    % Body Fat  24.7 %    Grip Strength  2.2 kg    Flexibility  --   hip problems   Single Leg Stand  --   unable to perform       Nutrition Therapy Plan and Nutrition Goals: Nutrition Therapy & Goals - 10/14/18 1449      Personal Nutrition Goals   Personal Goal #2  Patient is eating a heart healthy diet. Handout given on making healthy food choices.    Additional Goals?  No      Intervention Plan   Intervention  Nutrition handout(s) given to patient.       Nutrition Assessments: Nutrition Assessments - 10/14/18 1450      MEDFICTS Scores   Pre Score  36       Nutrition Goals Re-Evaluation:   Nutrition Goals Discharge (Final Nutrition Goals Re-Evaluation):   Psychosocial: Target Goals: Acknowledge presence or absence of significant depression and/or stress, maximize coping skills, provide positive support system. Participant is able to verbalize types and ability to use techniques and skills needed for reducing stress and depression.  Initial Review & Psychosocial Screening: Initial Psych Review & Screening - 10/14/18 1448      Initial Review   Current issues with  Current Depression      Family Dynamics   Good Support System?  Yes      Barriers   Psychosocial barriers to participate in program  Psychosocial barriers identified (see note)   Mainly because of health and  being SOB     Screening Interventions   Expected Outcomes  Short Term goal: Identification and review with participant of any Quality of Life or Depression concerns found by scoring the questionnaire.;Long Term goal: The participant improves quality of Life and PHQ9 Scores as seen by post scores and/or verbalization of changes       Quality of Life Scores: Quality of Life - 10/14/18 1413      Quality of Life   Select  Quality of Life      Quality of Life Scores   Health/Function Pre  11.63 %    Socioeconomic Pre  22.8 %    Psych/Spiritual Pre  23.14 %    Family Pre  28.8 %    GLOBAL Pre  18.36 %      Scores of 19 and below usually indicate a poorer quality of life in these areas.  A difference of  2-3 points is a clinically meaningful difference.  A difference of 2-3 points in the total score of the Quality of Life Index has been associated with significant improvement in overall quality of life, self-image, physical symptoms, and general health in studies assessing  change in quality of life.   PHQ-9: Recent Review Flowsheet Data    Depression screen [facility] 2/9 10/14/2018 04/08/2017   Decreased Interest 0 0   Down, Depressed, Hopeless 0 0   PHQ - 2 Score 0 0   Altered sleeping 0 -   Tired, decreased energy 1 -   Change in appetite 0 -   Feeling bad or failure about yourself  3 -   Trouble concentrating 0 -   Moving slowly or fidgety/restless 0 -   Suicidal thoughts 0 -   PHQ-9 Score 4 -   Difficult doing work/chores Somewhat difficult -     Interpretation of Total Score  Total Score Depression Severity:  1-4 = Minimal depression, 5-9 = Mild depression, 10-14 = Moderate depression, 15-19 = Moderately severe depression, 20-27 = Severe depression   Psychosocial Evaluation and Intervention: Psychosocial Evaluation - 10/14/18 1449      Psychosocial Evaluation & Interventions   Interventions  Encouraged to exercise with the program and follow exercise prescription    Expected  Outcomes  To feel better and not be so SOB    Continue Psychosocial Services   Follow up required by staff       Psychosocial Re-Evaluation:   Psychosocial Discharge (Final Psychosocial Re-Evaluation):    Education: Education Goals: Education classes will be provided on a weekly basis, covering required topics. Participant will state understanding/return demonstration of topics presented.  Learning Barriers/Preferences: Learning Barriers/Preferences - 10/14/18 1450      Learning Barriers/Preferences   Learning Barriers  None    Learning Preferences  Pictoral;Written Material       Education Topics: How Lungs Work and Diseases: - Discuss the anatomy of the lungs and diseases that can affect the lungs, such as COPD.   Exercise: -Discuss the importance of exercise, FITT principles of exercise, normal and abnormal responses to exercise, and how to exercise safely.   Environmental Irritants: -Discuss types of environmental irritants and how to limit exposure to environmental irritants.   Meds/Inhalers and oxygen: - Discuss respiratory medications, definition of an inhaler and oxygen, and the proper way to use an inhaler and oxygen.   Energy Saving Techniques: - Discuss methods to conserve energy and decrease shortness of breath when performing activities of daily living.    Bronchial Hygiene / Breathing Techniques: - Discuss breathing mechanics, pursed-lip breathing technique,  proper posture, effective ways to clear airways, and other functional breathing techniques   Cleaning Equipment: - Provides group verbal and written instruction about the health risks of elevated stress, cause of high stress, and healthy ways to reduce stress.   Nutrition I: Fats: - Discuss the types of cholesterol, what cholesterol does to the body, and how cholesterol levels can be controlled.   Nutrition II: Labels: -Discuss the different components of food labels and how to read food  labels.   Respiratory Infections: - Discuss the signs and symptoms of respiratory infections, ways to prevent respiratory infections, and the importance of seeking medical treatment when having a respiratory infection.   Stress I: Signs and Symptoms: - Discuss the causes of stress, how stress may lead to anxiety and depression, and ways to limit stress.   Stress II: Relaxation: -Discuss relaxation techniques to limit stress.   Oxygen for Home/Travel: - Discuss how to prepare for travel when on oxygen and proper ways to transport and store oxygen to ensure safety.   Knowledge Questionnaire Score: Knowledge Questionnaire Score - 10/14/18 1451      Knowledge  Questionnaire Score   Pre Score  15/18       Core Components/Risk Factors/Patient Goals at Admission: Personal Goals and Risk Factors at Admission - 10/14/18 1451      Core Components/Risk Factors/Patient Goals on Admission    Weight Management  Weight Maintenance    Personal Goal Other  Yes    Personal Goal  Be able to do thing again. Get back to normal ADL's    Intervention  Attend PR 2 x week and supplement with 3 x week at home exercise.    Expected Outcomes  To be able to breather better while doing daily task       Core Components/Risk Factors/Patient Goals Review:    Core Components/Risk Factors/Patient Goals at Discharge (Final Review):    ITP Comments: ITP Comments    Row Name 10/14/18 1433           ITP Comments  Patient has COPD and due to extreme SOB will not be able to walk the Treadmill. Will do all seated exercises.          Comments: Patient arrived for 1st visit/orientation/education at 1230. Patient was referred to PR by Dr. Halford Chessman due to COPD (J44.9). During orientation advised patient on arrival and appointment times what to wear, what to do before, during and after exercise. Reviewed attendance and class policy. Talked about inclement weather and class consultation policy. Pt is scheduled to  return Pulmonary Rehab on 10/20/18 at 1330. Pt was advised to come to class 15 minutes before class starts. Patient was also given instructions on meeting with the dietician and attending the Family Structure classes. Discussed RPE/Dpysnea scales. Discussed initial THR and how to find their radial and/or carotid pulse. Discussed the initial exercise prescription and how this effects their progress. Pt is eager to get started. Patient participated in warm up stretches followed by light weights and resistance bands. Patient attempted to complete 6 minute walk test. He was able to walk 2 minutes 50 seconds with 2 rest periods. Patient was on 6 L/min of oxygen during the test and 4 L/min at rest. Patient did not c/o pain during the walk test. Patient was measured for the equipment. Discussed equipment safety with patient. Took patient pre-anthropometric measurements. Patient finished visit at 1430.

## 2018-10-20 ENCOUNTER — Encounter (HOSPITAL_COMMUNITY)
Admission: RE | Admit: 2018-10-20 | Discharge: 2018-10-20 | Disposition: A | Discharge status: Home / Self Care | Payer: Medicare Other | Source: Ambulatory Visit | Attending: Pulmonary Disease | Admitting: Pulmonary Disease

## 2018-10-20 ENCOUNTER — Other Ambulatory Visit: Payer: Self-pay

## 2018-10-20 DIAGNOSIS — J449 Chronic obstructive pulmonary disease, unspecified: Secondary | ICD-10-CM | POA: Insufficient documentation

## 2018-10-20 NOTE — Progress Notes (Signed)
Daily Session Note  Patient Details  Name: Mark Cline MRN: [id number] Date of Birth: 04/25/1945 Referring Provider:     PULMONARY REHAB COPD ORIENTATION from 10/14/2018 in [place]  Referring Provider  Sood      Encounter Date: 10/20/2018  Check In: Session Check In - 10/20/18 1330      Check-In   Supervising physician immediately available to respond to emergencies  See telemetry face sheet for immediately available MD    Location  [facility]    Staff Present  Russella Dar, MS, EP, [facility], Exercise Physiologist;[place] Freshour Zachery Conch, Exercise Physiologist    Virtual Visit  No    Medication changes reported      No    Fall or balance concerns reported     No    Tobacco Cessation  No Change    Warm-up and Cool-down  Performed as group-led instruction    Resistance Training Performed  Yes    VAD Patient?  No    PAD/SET Patient?  No      Pain Assessment   Currently in Pain?  No/denies    Pain Score  0-No pain    Multiple Pain Sites  No       Capillary Blood Glucose: No results found for this or any previous visit (from the past 24 hour(s)).    Social History   Tobacco Use  Smoking Status Former Smoker  . Packs/day: 0.50  . Years: 60.00  . Pack years: 30.00  . Types: Cigarettes  . Quit date: 07/28/2016  . Years since quitting: 2.2  Smokeless Tobacco Never Used    Goals Met:  Proper associated with RPD/PD & O2 Sat Independence with exercise equipment Improved SOB with ADL's Using PLB without cueing & demonstrates good technique Exercise tolerated well No report of cardiac concerns or symptoms Strength training completed today  Goals Unmet:  Not Applicable  Comments: Pt able to follow exercise prescription today without complaint.  Will continue to monitor for progression. Check out 1430.   Dr. Sinda Du is Medical Director for [facility] Pulmonary Rehab.

## 2018-10-22 ENCOUNTER — Other Ambulatory Visit: Payer: Self-pay

## 2018-10-22 ENCOUNTER — Encounter (HOSPITAL_COMMUNITY)
Admission: RE | Admit: 2018-10-22 | Discharge: 2018-10-22 | Disposition: A | Discharge status: Home / Self Care | Payer: Medicare Other | Source: Ambulatory Visit | Attending: Pulmonary Disease | Admitting: Pulmonary Disease

## 2018-10-22 DIAGNOSIS — J449 Chronic obstructive pulmonary disease, unspecified: Secondary | ICD-10-CM | POA: Diagnosis not present

## 2018-10-22 NOTE — Progress Notes (Signed)
Daily Session Note  Patient Details  Name: Mark Cline MRN: [id number] Date of Birth: 07/12/1945 Referring Provider:     PULMONARY REHAB COPD ORIENTATION from 10/14/2018 in [place]  Referring Provider  Sood      Encounter Date: 10/22/2018  Check In: Session Check In - 10/22/18 1330      Check-In   Supervising physician immediately available to respond to emergencies  See telemetry face sheet for immediately available MD    Location  [facility]    Staff Present  Benay Pike, Exercise Physiologist;Debra Wynetta Emery, RN, BSN;Diane Coad, MS, EP, [facility], Exercise Physiologist    Virtual Visit  No    Medication changes reported      No    Fall or balance concerns reported     No    Tobacco Cessation  No Change    Warm-up and Cool-down  Performed as group-led instruction    Resistance Training Performed  Yes    VAD Patient?  No    PAD/SET Patient?  No      Pain Assessment   Currently in Pain?  No/denies    Pain Score  0-No pain    Multiple Pain Sites  No       Capillary Blood Glucose: No results found for this or any previous visit (from the past 24 hour(s)).    Social History   Tobacco Use  Smoking Status Former Smoker  . Packs/day: 0.50  . Years: 60.00  . Pack years: 30.00  . Types: Cigarettes  . Quit date: 07/28/2016  . Years since quitting: 2.2  Smokeless Tobacco Never Used    Goals Met:  Proper associated with RPD/PD & O2 Sat Independence with exercise equipment Improved SOB with ADL's Using PLB without cueing & demonstrates good technique No report of cardiac concerns or symptoms Strength training completed today  Goals Unmet:  Not Applicable  Comments: Pt able to follow exercise prescription today without complaint.  Will continue to monitor for progression. Check out 1430.   Dr. Sinda Du is Medical Director for [facility] Pulmonary Rehab.

## 2018-10-27 ENCOUNTER — Encounter (HOSPITAL_COMMUNITY)
Admission: RE | Admit: 2018-10-27 | Discharge: 2018-10-27 | Disposition: A | Discharge status: Home / Self Care | Payer: Medicare Other | Source: Ambulatory Visit | Attending: Pulmonary Disease | Admitting: Pulmonary Disease

## 2018-10-27 ENCOUNTER — Other Ambulatory Visit: Payer: Self-pay

## 2018-10-27 DIAGNOSIS — J449 Chronic obstructive pulmonary disease, unspecified: Secondary | ICD-10-CM

## 2018-10-27 NOTE — Progress Notes (Signed)
Daily Session Note  Patient Details  Name: BALLARD BUDNEY MRN: [id number] Date of Birth: 27-Feb-1945 Referring Provider:     PULMONARY REHAB COPD ORIENTATION from 10/14/2018 in [place]  Referring Provider  Sood      Encounter Date: 10/27/2018  Check In: Session Check In - 10/27/18 1408      Check-In   Supervising physician immediately available to respond to emergencies  See telemetry face sheet for immediately available MD    Location  [facility]    Staff Present  Russella Dar, MS, EP, [facility], Exercise Physiologist;Amanda Zachery Conch, Exercise Physiologist    Virtual Visit  No    Medication changes reported      No    Fall or balance concerns reported     No    Tobacco Cessation  No Change    Warm-up and Cool-down  Performed as group-led instruction    Resistance Training Performed  Yes    VAD Patient?  No    PAD/SET Patient?  No      Pain Assessment   Currently in Pain?  No/denies    Pain Score  0-No pain    Multiple Pain Sites  No       Capillary Blood Glucose: No results found for this or any previous visit (from the past 24 hour(s)).    Social History   Tobacco Use  Smoking Status Former Smoker  . Packs/day: 0.50  . Years: 60.00  . Pack years: 30.00  . Types: Cigarettes  . Quit date: 07/28/2016  . Years since quitting: 2.2  Smokeless Tobacco Never Used    Goals Met:  Proper associated with RPD/PD & O2 Sat Independence with exercise equipment Exercise tolerated well Personal goals reviewed Queuing for purse lip breathing No report of cardiac concerns or symptoms Strength training completed today  Goals Unmet:  Not Applicable  Comments: Check out 1430   Dr. Sinda Du is Medical Director for [facility].

## 2018-10-29 ENCOUNTER — Encounter: Payer: Self-pay | Admitting: Pulmonary Disease

## 2018-10-29 ENCOUNTER — Other Ambulatory Visit: Payer: Self-pay

## 2018-10-29 ENCOUNTER — Encounter (HOSPITAL_COMMUNITY): Payer: Medicare Other

## 2018-10-29 ENCOUNTER — Ambulatory Visit (INDEPENDENT_AMBULATORY_CARE_PROVIDER_SITE_OTHER): Payer: Medicare Other | Admitting: Pulmonary Disease

## 2018-10-29 VITALS — BP 118/64 | HR 83 | Temp 98.0°F | Ht 70.0 in | Wt 183.2 lb

## 2018-10-29 DIAGNOSIS — J9611 Chronic respiratory failure with hypoxia: Secondary | ICD-10-CM | POA: Diagnosis not present

## 2018-10-29 DIAGNOSIS — J449 Chronic obstructive pulmonary disease, unspecified: Secondary | ICD-10-CM | POA: Diagnosis not present

## 2018-10-29 NOTE — Progress Notes (Signed)
[facility]  Chief Complaint  Patient presents with  . COPD with chronic bronichitis and emphysema    Patient requests POC    Constitutional:  BP 118/64 (BP Location: Right Arm, Patient Position: Sitting, Cuff Size: Normal)   Pulse 83   Temp 98 F (36.7 C)   Ht 5\' 10"  (1.778 m)   Wt 183 lb 3.2 oz (83.1 kg)   SpO2 99% Comment: on 3L of O2  BMI 26.29 kg/m   Past Medical History:  HTN, OA, AAA, Prostate cancer  Brief Summary:  Mark Cline is a 73 y.o. male former smoker with COPD/emphysema and chronic hypoxic respiratory failure.  He uses symbicort in morning, but forgets to [place] at night sometimes.  Not needing albuterol much.  Not having cough, wheeze, sputum, chest congestion, or leg swelling.  Using oxygen, and wants POC.  Enrolled in pulmonary rehab.  Wears him out after class, but feels it is helping.  Physical Exam:   Appearance - well kempt, wearing oxygen  ENMT - no sinus tenderness, no nasal discharge, no oral exudate  Neck - no masses, trachea midline, no thyromegaly, no elevation in JVP  Respiratory - normal appearance of chest wall, normal respiratory effort w/o accessory muscle use, no dullness on percussion, no wheezing or rales  CV - s1s2 regular rate and rhythm, no murmurs, no peripheral edema, radial pulses symmetric  GI - soft, non tender  Lymph - no adenopathy noted in neck and axillary areas  MSK - normal gait  Ext - no cyanosis, clubbing, or joint inflammation noted  Skin - no rashes, lesions, or ulcers  Neuro - normal strength, oriented x 3  Psych - normal mood and affect   Assessment/Plan:   COPD with emphysema and chronic bronchitis. - continue symbicort and prn albuterol - enrolled in pulmonary rehab  Chronic respiratory failure with hypoxia. - continue 2 liters oxygen with rest and sleep at night - needs 4 liters continuous flow oxygen with exertion - not a candidate for POC - advised  that he could contact his DME to arrange for rental equipment when his granddaughter graduates from [employer]    Patient Instructions  Follow up in 6 months    [name], MD [place] Pager: [phone] 10/29/2018, 11:36 AM  Flow Sheet     Pulmonary tests:  A1AT 09/10/18 >> 139, MM  ONO with RA 09/14/18 >> test time 13 hrs 58 min.  Baseline SpO2 91%, low SpO2 77%.  Spent 2 hrs 7 min with SpO2 < 88%.  Chest imaging:  CT chest 07/15/17 >> atherosclerosis, calcified Rt hilar/paratracheal/subcarinal LN, advanced changes of emphysema, bronchial wall thickening, 1.5 cm density RUL, calcified granuloma RML and liver/spleen CT chest 12/09/17 >> stable RUL density, new patchy ASD lower lobes Lt > Rt  Medications:   Allergies as of 10/29/2018   No Known Allergies     Medication List       Accurate as of October 29, 2018 11:36 AM. If you have any questions, ask your nurse or doctor.        ALBUTEROL IN Inhale into the lungs as needed.   budesonide-formoterol 160-4.5 MCG/ACT inhaler Commonly known as: Symbicort Inhale 2 puffs into the lungs 2 (two) times daily.   lisinopril 10 MG tablet Commonly known as: ZESTRIL Take 10 mg by mouth every morning.       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  Family History:  His family history includes Cancer (age of onset: 65) in his father.  Social History:  He  reports that he quit smoking about 2 years ago. His smoking use included cigarettes. He has a 30.00 pack-year smoking history. He has never used smokeless tobacco. He reports that he does not drink alcohol or use drugs.

## 2018-10-29 NOTE — Progress Notes (Signed)
Pulmonary Individual Treatment Plan  Patient Details  Name: Mark Cline MRN: [id number] Date of Birth: [birth year] Referring Provider:     PULMONARY REHAB COPD ORIENTATION from 10/14/2018 in [place]  Referring Provider  Sood      Initial Encounter Date:    PULMONARY REHAB COPD ORIENTATION from 10/14/2018 in [place]  Date  10/14/18      Visit Diagnosis: COPD with chronic bronchitis and emphysema ([place])  Patient's Home Medications on Admission:   Current Outpatient Medications:  .  ALBUTEROL IN, Inhale into the lungs as needed., Disp: , Rfl:  .  budesonide-formoterol (SYMBICORT) 160-4.5 MCG/ACT inhaler, Inhale 2 puffs into the lungs 2 (two) times daily., Disp: 1 Inhaler, Rfl: 6 .  lisinopril (PRINIVIL,ZESTRIL) 10 MG tablet, Take 10 mg by mouth every morning. , Disp: , Rfl:   Past Medical History: Past Medical History:  Diagnosis Date  . AAA (abdominal aortic aneurysm) ([place]) followed by pcp   infarenal , per CT 03-17-2017 , 4.4cm  . Arthritis    hands  . COPD with emphysema ([place])    followed by pcp  . Dyspnea on exertion   . 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 Hypertension   . Prostate cancer [facility]) urologist-  dr wrenn/  onologist-- dr Tammi Klippel   dx 02-28-2017-- STage T2b, Gleason 4+5, PSA 8.8-- scheduled for radiactive seed implants 07-31-2017 then IMRT  . Wears glasses   . Wears partial dentures    upper    Tobacco Use: Social History   Tobacco Use  Smoking Status Former Smoker  . Packs/day: 0.50  . Years: 60.00  . Pack years: 30.00  . Types: Cigarettes  . Quit date: 07/28/2016  . Years since quitting: 2.2  Smokeless Tobacco Never Used    Labs: Recent Review [facility] for [facility] and Pulmonary Rehab Latest Ref Rng & Units 01/14/2014   TCO2 0 - 100 mmol/L 25      Capillary Blood Glucose: No results found for: GLUCAP   Pulmonary Assessment Scores: Pulmonary Assessment Scores     Row Name 10/14/18 1445         ADL UCSD   ADL Phase  Entry     SOB Score total  100     Rest  0     Walk  13     Stairs  5     Bath  5     Dress  5     Shop  5       CAT Score   CAT Score  20       mMRC Score   mMRC Score  3       UCSD: Self-administered rating of dyspnea associated with activities of daily living (ADLs) 6-point scale (0 = "not at all" to 5 = "maximal or unable to do because of breathlessness")  Scoring Scores range from 0 to 120.  Minimally important difference is 5 units  CAT: CAT can identify the health impairment of COPD patients and is better correlated with disease progression.  CAT has a scoring range of zero to 40. The CAT score is classified into four groups of low (less than 10), medium (10 - 20), high (21-30) and very high (31-40) based on the impact level of disease on health status. A CAT score over 10 suggests significant symptoms.  A worsening CAT score could be explained by an exacerbation, poor medication adherence, poor inhaler  technique, or progression of COPD or comorbid conditions.  CAT MCID is 2 points  mMRC: mMRC (Modified Medical Research Council) Dyspnea Scale is used to assess the degree of baseline functional disability in patients of respiratory disease due to dyspnea. No minimal important difference is established. A decrease in score of 1 point or greater is considered a positive change.   Pulmonary Function Assessment:   Exercise Target Goals: Exercise Program Goal: Individual exercise prescription set using results from initial 6 min walk test and THRR while considering  patient's activity barriers and safety.   Exercise Prescription Goal: Initial exercise prescription builds to 30-45 minutes a day of aerobic activity, 2-3 days per week.  Home exercise guidelines will be given to patient during program as part of exercise prescription that the participant will acknowledge.  Activity Barriers & Risk Stratification: Activity  Barriers & Cardiac Risk Stratification - 10/14/18 1348      Activity Barriers & Cardiac Risk Stratification   Activity Barriers  Shortness of Breath;Deconditioning    Cardiac Risk Stratification  High       6 Minute Walk: 6 Minute Walk    Row Name 10/14/18 1347         6 Minute Walk   Phase  Initial     Distance  400 feet     Walk Time  2.5 minutes     # of Rest Breaks  2     MPH  0.75     METS  1.58     RPE  17     Perceived Dyspnea   16     VO2 Peak  4.83     Symptoms  Yes (comment)     Comments  extreme SOB     Resting HR  100 bpm     Resting BP  100/64     Resting Oxygen Saturation   96 %     Exercise Oxygen Saturation  during 6 min walk  94 %     Max Ex. HR  125 bpm     Max Ex. BP  120/68     2 Minute Post BP  108/64        Oxygen Initial Assessment: Oxygen Initial Assessment - 10/14/18 1442      Home Oxygen   Home Oxygen Device  Home Concentrator;E-Tanks    Sleep Oxygen Prescription  Continuous    Liters per minute  2    Home Exercise Oxygen Prescription  None    Home at Rest Exercise Oxygen Prescription  None    Compliance with Home Oxygen Use  Yes      Initial 6 min Walk   Oxygen Used  Continuous    Liters per minute  6      Program Oxygen Prescription   Program Oxygen Prescription  Continuous    Liters per minute  6      Intervention   Short Term Goals  To learn and understand importance of maintaining oxygen saturations>88%;To learn and demonstrate proper pursed lip breathing techniques or other breathing techniques.;To learn and understand importance of monitoring SPO2 with pulse oximeter and demonstrate accurate use of the pulse oximeter.;To learn and exhibit compliance with exercise, home and travel O2 prescription    Long  Term Goals  Exhibits compliance with exercise, home and travel O2 prescription;Maintenance of O2 saturations>88%;Exhibits proper breathing techniques, such as pursed lip breathing or other method taught during program session        Oxygen Re-Evaluation: Oxygen Re-Evaluation  [place] Name 10/29/18 0913             Program Oxygen Prescription   Program Oxygen Prescription  Continuous       Liters per minute  4       FiO2%  99       Comments  Patient uses O2 4 to 6 L/M prn during exercise.         Home Oxygen   Home Oxygen Device  Home Concentrator;E-Tanks       Sleep Oxygen Prescription  Continuous       Liters per minute  2       Home Exercise Oxygen Prescription  Continuous       Liters per minute  4       Home at Rest Exercise Oxygen Prescription  Continuous       Liters per minute  2       Compliance with Home Oxygen Use  Yes         Goals/Expected Outcomes   Short Term Goals  To learn and understand importance of maintaining oxygen saturations>88%;To learn and demonstrate proper pursed lip breathing techniques or other breathing techniques.;To learn and understand importance of monitoring SPO2 with pulse oximeter and demonstrate accurate use of the pulse oximeter.;To learn and exhibit compliance with exercise, home and travel O2 prescription       Long  Term Goals  Exhibits compliance with exercise, home and travel O2 prescription;Maintenance of O2 saturations>88%;Exhibits proper breathing techniques, such as pursed lip breathing or other method taught during program session       Comments  Patient is able to verbalize the importance of maintaining his O2 >88% and is able to properly demonstrate pursed lip breathing properly. We are not allowing them to use the pulse oximeter due to COVID. Will continue to monitor for progress.       Goals/Expected Outcomes  Patient will continue to meet both his short and long term goals.          Oxygen Discharge (Final Oxygen Re-Evaluation): Oxygen Re-Evaluation - 10/29/18 0913      Program Oxygen Prescription   Program Oxygen Prescription  Continuous    Liters per minute  4    FiO2%  99    Comments  Patient uses O2 4 to 6 L/M prn during exercise.       Home Oxygen   Home Oxygen Device  Home Concentrator;E-Tanks    Sleep Oxygen Prescription  Continuous    Liters per minute  2    Home Exercise Oxygen Prescription  Continuous    Liters per minute  4    Home at Rest Exercise Oxygen Prescription  Continuous    Liters per minute  2    Compliance with Home Oxygen Use  Yes      Goals/Expected Outcomes   Short Term Goals  To learn and understand importance of maintaining oxygen saturations>88%;To learn and demonstrate proper pursed lip breathing techniques or other breathing techniques.;To learn and understand importance of monitoring SPO2 with pulse oximeter and demonstrate accurate use of the pulse oximeter.;To learn and exhibit compliance with exercise, home and travel O2 prescription    Long  Term Goals  Exhibits compliance with exercise, home and travel O2 prescription;Maintenance of O2 saturations>88%;Exhibits proper breathing techniques, such as pursed lip breathing or other method taught during program session    Comments  Patient is able to verbalize the importance of maintaining his O2 >88% and is able to properly  demonstrate pursed lip breathing properly. We are not allowing them to use the pulse oximeter due to COVID. Will continue to monitor for progress.    Goals/Expected Outcomes  Patient will continue to meet both his short and long term goals.       Initial Exercise Prescription: Initial Exercise Prescription - 10/14/18 1300      Date of Initial Exercise RX and Referring Provider   Date  10/14/18    Referring Provider  Sood    Expected Discharge Date  01/13/19      Oxygen   Oxygen  Continuous    Liters  6   with exertion     NuStep   Level  1    SPM  88    Minutes  17    METs  2.1      Arm/Foot Ergometer   Level  1    Watts  12    Minutes  17    METs  1.9      Prescription Details   Frequency (times per week)  3    Duration  Progress to 30 minutes of continuous aerobic without signs/symptoms of physical  distress      Intensity   THRR 40-80% of Max Heartrate  [phone]    Ratings of Perceived Exertion  11-13    Perceived Dyspnea  0-4      Progression   Progression  Continue progressive overload as per policy without signs/symptoms or physical distress.      Resistance Training   Training Prescription  Yes    Weight  1    Reps  10-15       Perform Capillary Blood Glucose checks as needed.  Exercise Prescription Changes:  Exercise Prescription Changes    Row Name 10/14/18 1300             Response to Exercise   Blood Pressure (Admit)  100/64       Blood Pressure (Exercise)  120/68       Blood Pressure (Exit)  108/64       Heart Rate (Admit)  100 bpm       Heart Rate (Exercise)  125 bpm       Heart Rate (Exit)  110 bpm       Oxygen Saturation (Admit)  96 %       Oxygen Saturation (Exercise)  94 %       Oxygen Saturation (Exit)  96 %       Rating of Perceived Exertion (Exercise)  17       Perceived Dyspnea (Exercise)  16       Symptoms  SOB       Comments  6MWT       Duration  Progress to 30 minutes of  aerobic without signs/symptoms of physical distress       Intensity  THRR New [phone]          Exercise Comments:  Exercise Comments    Row Name 10/28/18 1454           Exercise Comments  Kelvyn has attended 4 sessions so far. He is working on the Scientist, product/process development. The Nustep is the more difficult piece of equipment for him and he has to stop several times during the 17 minutes due to shortness of breath. He has already seen some improvement though and reports that he is already starting to feel better and more energized.  Exercise Goals and Review:  Exercise Goals    Row Name 10/14/18 1350             Exercise Goals   Increase Physical Activity  Yes       Intervention  Provide advice, education, support and counseling about physical activity/exercise needs.;Develop an individualized exercise prescription for aerobic and resistive  training based on initial evaluation findings, risk stratification, comorbidities and participant's personal goals.       Expected Outcomes  Short Term: Attend rehab on a regular basis to increase amount of physical activity.;Long Term: Add in home exercise to make exercise part of routine and to increase amount of physical activity.;Long Term: Exercising regularly at least 3-5 days a week.       Increase Strength and Stamina  Yes       Intervention  Provide advice, education, support and counseling about physical activity/exercise needs.;Develop an individualized exercise prescription for aerobic and resistive training based on initial evaluation findings, risk stratification, comorbidities and participant's personal goals.       Expected Outcomes  Short Term: Increase workloads from initial exercise prescription for resistance, speed, and METs.;Short Term: Perform resistance training exercises routinely during rehab and add in resistance training at home;Long Term: Improve cardiorespiratory fitness, muscular endurance and strength as measured by increased METs and functional capacity (6MWT)       Able to understand and use rate of perceived exertion (RPE) scale  Yes       Intervention  Provide education and explanation on how to use RPE scale       Expected Outcomes  Short Term: Able to use RPE daily in rehab to express subjective intensity level;Long Term:  Able to use RPE to guide intensity level when exercising independently       Able to understand and use Dyspnea scale  Yes       Intervention  Provide education and explanation on how to use Dyspnea scale       Expected Outcomes  Short Term: Able to use Dyspnea scale daily in rehab to express subjective sense of shortness of breath during exertion;Long Term: Able to use Dyspnea scale to guide intensity level when exercising independently       Knowledge and understanding of Target Heart Rate Range (THRR)  Yes       Intervention  Provide education  and explanation of THRR including how the numbers were predicted and where they are located for reference       Expected Outcomes  Short Term: Able to state/look up THRR;Long Term: Able to use THRR to govern intensity when exercising independently;Short Term: Able to use daily as guideline for intensity in rehab       Able to check pulse independently  Yes       Intervention  Provide education and demonstration on how to check pulse in carotid and radial arteries.;Review the importance of being able to check your own pulse for safety during independent exercise       Expected Outcomes  Short Term: Able to explain why pulse checking is important during independent exercise;Long Term: Able to check pulse independently and accurately       Understanding of Exercise Prescription  Yes       Intervention  Provide education, explanation, and written materials on patient's individual exercise prescription       Expected Outcomes  Short Term: Able to explain program exercise prescription;Long Term: Able to explain home exercise prescription to exercise independently  Exercise Goals Re-Evaluation : Exercise Goals Re-Evaluation    [place] Name 10/28/18 1452             Exercise Goal Re-Evaluation   Exercise Goals Review  Increase Physical Activity;Increase Strength and Stamina;Able to understand and use rate of perceived exertion (RPE) scale;Able to understand and use Dyspnea scale;Knowledge and understanding of Target Heart Rate Range (THRR);Able to check pulse independently;Understanding of Exercise Prescription       Comments  Pt. is new to the program. Although he is rather debilitated because of his shortness of breath he comes to rehab ready and willing to work the best he can.       Expected Outcomes  Short: increase overall activity level Long: walk to his trashcan without having to stop.          Discharge Exercise Prescription (Final Exercise Prescription Changes): Exercise Prescription  Changes - 10/14/18 1300      Response to Exercise   Blood Pressure (Admit)  100/64    Blood Pressure (Exercise)  120/68    Blood Pressure (Exit)  108/64    Heart Rate (Admit)  100 bpm    Heart Rate (Exercise)  125 bpm    Heart Rate (Exit)  110 bpm    Oxygen Saturation (Admit)  96 %    Oxygen Saturation (Exercise)  94 %    Oxygen Saturation (Exit)  96 %    Rating of Perceived Exertion (Exercise)  17    Perceived Dyspnea (Exercise)  16    Symptoms  SOB    Comments  6MWT    Duration  Progress to 30 minutes of  aerobic without signs/symptoms of physical distress    Intensity  THRR New   [phone]      Nutrition:  Target Goals: Understanding of nutrition guidelines, daily intake of sodium 1500mg , cholesterol 200mg , calories 30% from fat and 7% or less from saturated fats, daily to have 5 or more servings of fruits and vegetables.  Biometrics: Pre Biometrics - 10/14/18 1355      Pre Biometrics   Height  5\' 10"  (1.778 m)    Waist Circumference  41.5 inches    Hip Circumference  40 inches    Waist to Hip Ratio  1.04 %    Triceps Skinfold  7 mm    % Body Fat  24.7 %    Grip Strength  2.2 kg    Flexibility  --   hip problems   Single Leg Stand  --   unable to perform       Nutrition Therapy Plan and Nutrition Goals: Nutrition Therapy & Goals - 10/29/18 0920      Personal Nutrition Goals   Comments  We have not resumed the RD class post COVID 19. We are working with our new dietician to resume the classes safely. In the intermin we are providing education through hand-outs. Patient says he has not changed his diet since he started the program. Will continue to monitor for progress.      Intervention Plan   Intervention  Nutrition handout(s) given to patient.    Expected Outcomes  Short Term Goal: Understand basic principles of dietary content, such as calories, fat, sodium, cholesterol and nutrients.       Nutrition Assessments: Nutrition Assessments - 10/14/18 1450       MEDFICTS Scores   Pre Score  36       Nutrition Goals Re-Evaluation:   Nutrition Goals Discharge (Final Nutrition Goals Re-Evaluation):  Psychosocial: Target Goals: Acknowledge presence or absence of significant depression and/or stress, maximize coping skills, provide positive support system. Participant is able to verbalize types and ability to use techniques and skills needed for reducing stress and depression.  Initial Review & Psychosocial Screening: Initial Psych Review & Screening - 10/14/18 1448      Initial Review   Current issues with  Current Depression      Family Dynamics   Good Support System?  Yes      Barriers   Psychosocial barriers to participate in program  Psychosocial barriers identified (see note)   Mainly because of health and being SOB     Screening Interventions   Expected Outcomes  Short Term goal: Identification and review with participant of any Quality of Life or Depression concerns found by scoring the questionnaire.;Long Term goal: The participant improves quality of Life and PHQ9 Scores as seen by post scores and/or verbalization of changes       Quality of Life Scores: Quality of Life - 10/14/18 1413      Quality of Life   Select  Quality of Life      Quality of Life Scores   Health/Function Pre  11.63 %    Socioeconomic Pre  22.8 %    Psych/Spiritual Pre  23.14 %    Family Pre  28.8 %    GLOBAL Pre  18.36 %      Scores of 19 and below usually indicate a poorer quality of life in these areas.  A difference of  2-3 points is a clinically meaningful difference.  A difference of 2-3 points in the total score of the Quality of Life Index has been associated with significant improvement in overall quality of life, self-image, physical symptoms, and general health in studies assessing change in quality of life.   PHQ-9: Recent Review Flowsheet Data    Depression screen [facility] 2/9 10/14/2018 04/08/2017   Decreased Interest 0 0   Down,  Depressed, Hopeless 0 0   PHQ - 2 Score 0 0   Altered sleeping 0 -   Tired, decreased energy 1 -   Change in appetite 0 -   Feeling bad or failure about yourself  3 -   Trouble concentrating 0 -   Moving slowly or fidgety/restless 0 -   Suicidal thoughts 0 -   PHQ-9 Score 4 -   Difficult doing work/chores Somewhat difficult -     Interpretation of Total Score  Total Score Depression Severity:  1-4 = Minimal depression, 5-9 = Mild depression, 10-14 = Moderate depression, 15-19 = Moderately severe depression, 20-27 = Severe depression   Psychosocial Evaluation and Intervention: Psychosocial Evaluation - 10/14/18 1449      Psychosocial Evaluation & Interventions   Interventions  Encouraged to exercise with the program and follow exercise prescription    Expected Outcomes  To feel better and not be so SOB    Continue Psychosocial Services   Follow up required by staff       Psychosocial Re-Evaluation: Psychosocial Re-Evaluation    [place] Name 10/29/18 0928             Psychosocial Re-Evaluation   Current issues with  None Identified       Comments  Patient's initial QOL score was 18.36% and his PHQ-9 score was 4 with no psychosocial issues identified. Will continue to monitor for progress.       Expected Outcomes  Patient will have no psychosocial issues identified at  discharge.       Interventions  Stress management education;Encouraged to attend Pulmonary Rehabilitation for the exercise;Relaxation education       Continue Psychosocial Services   No Follow up required          Psychosocial Discharge (Final Psychosocial Re-Evaluation): Psychosocial Re-Evaluation - 10/29/18 2774      Psychosocial Re-Evaluation   Current issues with  None Identified    Comments  Patient's initial QOL score was 18.36% and his PHQ-9 score was 4 with no psychosocial issues identified. Will continue to monitor for progress.    Expected Outcomes  Patient will have no psychosocial issues identified  at discharge.    Interventions  Stress management education;Encouraged to attend Pulmonary Rehabilitation for the exercise;Relaxation education    Continue Psychosocial Services   No Follow up required        Education: Education Goals: Education classes will be provided on a weekly basis, covering required topics. Participant will state understanding/return demonstration of topics presented.  Learning Barriers/Preferences: Learning Barriers/Preferences - 10/14/18 1450      Learning Barriers/Preferences   Learning Barriers  None    Learning Preferences  Pictoral;Written Material       Education Topics: How Lungs Work and Diseases: - Discuss the anatomy of the lungs and diseases that can affect the lungs, such as COPD.   PULMONARY REHAB CHRONIC OBSTRUCTIVE PULMONARY DISEASE from 10/22/2018 in [place]  Date  10/22/18  Educator  DJ  Instruction Review Code  2- Demonstrated Understanding      Exercise: -Discuss the importance of exercise, FITT principles of exercise, normal and abnormal responses to exercise, and how to exercise safely.   Environmental Irritants: -Discuss types of environmental irritants and how to limit exposure to environmental irritants.   Meds/Inhalers and oxygen: - Discuss respiratory medications, definition of an inhaler and oxygen, and the proper way to use an inhaler and oxygen.   Energy Saving Techniques: - Discuss methods to conserve energy and decrease shortness of breath when performing activities of daily living.    Bronchial Hygiene / Breathing Techniques: - Discuss breathing mechanics, pursed-lip breathing technique,  proper posture, effective ways to clear airways, and other functional breathing techniques   Cleaning Equipment: - Provides group verbal and written instruction about the health risks of elevated stress, cause of high stress, and healthy ways to reduce stress.   Nutrition I: Fats: - Discuss the  types of cholesterol, what cholesterol does to the body, and how cholesterol levels can be controlled.   Nutrition II: Labels: -Discuss the different components of food labels and how to read food labels.   Respiratory Infections: - Discuss the signs and symptoms of respiratory infections, ways to prevent respiratory infections, and the importance of seeking medical treatment when having a respiratory infection.   Stress I: Signs and Symptoms: - Discuss the causes of stress, how stress may lead to anxiety and depression, and ways to limit stress.   Stress II: Relaxation: -Discuss relaxation techniques to limit stress.   Oxygen for Home/Travel: - Discuss how to prepare for travel when on oxygen and proper ways to transport and store oxygen to ensure safety.   Knowledge Questionnaire Score: Knowledge Questionnaire Score - 10/14/18 1451      Knowledge Questionnaire Score   Pre Score  15/18       Core Components/Risk Factors/Patient Goals at Admission: Personal Goals and Risk Factors at Admission - 10/14/18 1451      Core Components/Risk Factors/Patient Goals on  Admission    Weight Management  Weight Maintenance    Personal Goal Other  Yes    Personal Goal  Be able to do thing again. Get back to normal ADL's    Intervention  Attend PR 2 x week and supplement with 3 x week at home exercise.    Expected Outcomes  To be able to breather better while doing daily task       Core Components/Risk Factors/Patient Goals Review:  Goals and Risk Factor Review    Row Name 10/29/18 6203             Core Components/Risk Factors/Patient Goals Review   Personal Goals Review  - Be able to do things again; get back to ADL's; take out trash.       Review  Patient has completed 4 sessions losing 1 lb since his initial visit. He is doing well in the program with progression. He says he is able to get around the house better and is walking better. He also states taking showers are getting  better. He used to have to stop 3 to 4 times and now he only has to stop once to "catch his breath". He is very pleased with his progress with only 4 sessions. Will continue to monitor for progress.       Expected Outcomes  Patient will continue to attend sessions and complete the program meeting his personal goals.          Core Components/Risk Factors/Patient Goals at Discharge (Final Review):  Goals and Risk Factor Review - 10/29/18 0922      Core Components/Risk Factors/Patient Goals Review   Personal Goals Review  --   Be able to do things again; get back to ADL's; take out trash.   Review  Patient has completed 4 sessions losing 1 lb since his initial visit. He is doing well in the program with progression. He says he is able to get around the house better and is walking better. He also states taking showers are getting better. He used to have to stop 3 to 4 times and now he only has to stop once to "catch his breath". He is very pleased with his progress with only 4 sessions. Will continue to monitor for progress.    Expected Outcomes  Patient will continue to attend sessions and complete the program meeting his personal goals.       ITP Comments: ITP Comments    Row Name 10/14/18 1433           ITP Comments  Patient has COPD and due to extreme SOB will not be able to walk the Treadmill. Will do all seated exercises.          Comments: ITP REVIEW Pt is making expected progress toward pulmonary rehab goals after completing 4 sessions. Recommend continued exercise, life style modification, education, and utilization of breathing techniques to increase stamina and strength and decrease shortness of breath with exertion.

## 2018-10-29 NOTE — Patient Instructions (Addendum)
Follow up in 6 months 

## 2018-11-03 ENCOUNTER — Encounter (HOSPITAL_COMMUNITY)
Admission: RE | Admit: 2018-11-03 | Discharge: 2018-11-03 | Disposition: A | Discharge status: Home / Self Care | Payer: Medicare Other | Source: Ambulatory Visit | Attending: Pulmonary Disease | Admitting: Pulmonary Disease

## 2018-11-03 ENCOUNTER — Other Ambulatory Visit: Payer: Self-pay

## 2018-11-03 DIAGNOSIS — J449 Chronic obstructive pulmonary disease, unspecified: Secondary | ICD-10-CM | POA: Diagnosis not present

## 2018-11-03 NOTE — Progress Notes (Signed)
Daily Session Note  Patient Details  Name: Mark Cline MRN: [id number] Date of Birth: 1945/01/23 Referring Provider:     PULMONARY REHAB COPD ORIENTATION from 10/14/2018 in [place]  Referring Provider  Sood      Encounter Date: 11/03/2018  Check In: Session Check In - 11/03/18 1330      Check-In   Supervising physician immediately available to respond to emergencies  See telemetry face sheet for immediately available MD    Location  [facility]    Staff Present  Russella Dar, MS, EP, [facility] , Exercise Physiologist;Cheskel Silverio Zachery Conch, Exercise Physiologist    Virtual Visit  No    Medication changes reported      No    Fall or balance concerns reported     No    Tobacco Cessation  No Change    Warm-up and Cool-down  Performed as group-led instruction    Resistance Training Performed  Yes    VAD Patient?  No    PAD/SET Patient?  No      Pain Assessment   Currently in Pain?  No/denies    Pain Score  0-No pain    Multiple Pain Sites  No       Capillary Blood Glucose: No results found for this or any previous visit (from the past 24 hour(s)).    Social History   Tobacco Use  Smoking Status Former Smoker  . Packs/day: 0.50  . Years: 60.00  . Pack years: 30.00  . Types: Cigarettes  . Quit date: 07/28/2016  . Years since quitting: 2.2  Smokeless Tobacco Never Used    Goals Met:  Proper associated with RPD/PD & O2 Sat Independence with exercise equipment Using PLB without cueing & demonstrates good technique Exercise tolerated well No report of cardiac concerns or symptoms Strength training completed today  Goals Unmet:  Not Applicable  Comments: Pt able to follow exercise prescription today without complaint.  Will continue to monitor for progression. Check out 1430.   Dr. Sinda Du is Medical Director for [facility] Pulmonary Re[facility].

## 2018-11-05 ENCOUNTER — Encounter (HOSPITAL_COMMUNITY): Payer: Medicare Other

## 2018-11-10 ENCOUNTER — Other Ambulatory Visit: Payer: Self-pay

## 2018-11-10 ENCOUNTER — Encounter (HOSPITAL_COMMUNITY)
Admission: RE | Admit: 2018-11-10 | Discharge: 2018-11-10 | Disposition: A | Discharge status: Home / Self Care | Payer: Medicare Other | Source: Ambulatory Visit | Attending: Pulmonary Disease | Admitting: Pulmonary Disease

## 2018-11-10 DIAGNOSIS — J449 Chronic obstructive pulmonary disease, unspecified: Secondary | ICD-10-CM | POA: Diagnosis not present

## 2018-11-10 NOTE — Progress Notes (Signed)
Daily Session Note  Patient Details  Name: Mark Cline MRN: [id number] Date of Birth: Oct 03, 1945 Referring Provider:     PULMONARY REHAB COPD ORIENTATION from 10/14/2018 in [place]  Referring Provider  Sood      Encounter Date: 11/10/2018  Check In: Session Check In - 11/10/18 1330      Check-In   Supervising physician immediately available to respond to emergencies  See telemetry face sheet for immediately available MD    Location  [facility]    Staff Present  Russella Dar, MS, EP, [facility], Exercise Physiologist;Mayco Walrond Zachery Conch, Exercise Physiologist    Virtual Visit  No    Medication changes reported      No    Fall or balance concerns reported     No    Tobacco Cessation  No Change    Warm-up and Cool-down  Performed as group-led instruction    Resistance Training Performed  Yes    VAD Patient?  No    PAD/SET Patient?  No      Pain Assessment   Currently in Pain?  No/denies    Pain Score  0-No pain    Multiple Pain Sites  No       Capillary Blood Glucose: No results found for this or any previous visit (from the past 24 hour(s)).    Social History   Tobacco Use  Smoking Status Former Smoker  . Packs/day: 0.50  . Years: 60.00  . Pack years: 30.00  . Types: Cigarettes  . Quit date: 07/28/2016  . Years since quitting: 2.2  Smokeless Tobacco Never Used    Goals Met:  Proper associated with RPD/PD & O2 Sat Independence with exercise equipment Exercise tolerated well Queuing for purse lip breathing No report of cardiac concerns or symptoms Strength training completed today  Goals Unmet:  Not Applicable  Comments: Pt able to follow exercise prescription today without complaint.  Will continue to monitor for progression. Check out 1430.   Dr. Sinda Du is Medical Director for [facility] Pulmonary Rehab.

## 2018-11-12 ENCOUNTER — Encounter (HOSPITAL_COMMUNITY): Payer: Medicare Other

## 2018-11-17 ENCOUNTER — Encounter (HOSPITAL_COMMUNITY)
Admission: RE | Admit: 2018-11-17 | Discharge: 2018-11-17 | Disposition: A | Discharge status: Home / Self Care | Payer: Medicare Other | Source: Ambulatory Visit | Attending: Pulmonary Disease | Admitting: Pulmonary Disease

## 2018-11-17 ENCOUNTER — Other Ambulatory Visit: Payer: Self-pay

## 2018-11-17 DIAGNOSIS — J449 Chronic obstructive pulmonary disease, unspecified: Secondary | ICD-10-CM | POA: Diagnosis not present

## 2018-11-17 NOTE — Progress Notes (Signed)
Daily Session Note  Patient Details  Name: BERDELL HOSTETLER MRN: [id number] Date of Birth: 1945-07-10 Referring Provider:     PULMONARY REHAB COPD ORIENTATION from 10/14/2018 in [place]  Referring Provider  Sood      Encounter Date: 11/17/2018  Check In: Session Check In - 11/17/18 1546      Check-In   Supervising physician immediately available to respond to emergencies  See telemetry face sheet for immediately available MD    Location  [facility]    Staff Present  Russella Dar, MS, EP, [facility], Exercise Physiologist;Other    Virtual Visit  No    Medication changes reported      No    Fall or balance concerns reported     No    Tobacco Cessation  No Change    Warm-up and Cool-down  Performed as group-led instruction    Resistance Training Performed  Yes    VAD Patient?  No    PAD/SET Patient?  No      Pain Assessment   Currently in Pain?  No/denies    Pain Score  0-No pain    Multiple Pain Sites  No       Capillary Blood Glucose: No results found for this or any previous visit (from the past 24 hour(s)).    Social History   Tobacco Use  Smoking Status Former Smoker  . Packs/day: 0.50  . Years: 60.00  . Pack years: 30.00  . Types: Cigarettes  . Quit date: 07/28/2016  . Years since quitting: 2.3  Smokeless Tobacco Never Used    Goals Met:  Proper associated with RPD/PD & O2 Sat Independence with exercise equipment Using PLB without cueing & demonstrates good technique Exercise tolerated well Personal goals reviewed No report of cardiac concerns or symptoms Strength training completed today  Goals Unmet:  Not Applicable  Comments: Check out: 1430   Dr. Sinda Du is Medical Director for [facility] Re[facility].

## 2018-11-19 ENCOUNTER — Encounter (HOSPITAL_COMMUNITY)
Admission: RE | Admit: 2018-11-19 | Discharge: 2018-11-19 | Disposition: A | Discharge status: Home / Self Care | Payer: Medicare Other | Source: Ambulatory Visit | Attending: Pulmonary Disease | Admitting: Pulmonary Disease

## 2018-11-19 ENCOUNTER — Other Ambulatory Visit: Payer: Self-pay

## 2018-11-19 DIAGNOSIS — J449 Chronic obstructive pulmonary disease, unspecified: Secondary | ICD-10-CM

## 2018-11-19 NOTE — Progress Notes (Signed)
Daily Session Note  Patient Details  Name: Mark Cline MRN: [id number] Date of Birth: 26-Oct-1945 Referring Provider:     PULMONARY REHAB COPD ORIENTATION from 10/14/2018 in [place]  Referring Provider  Sood      Encounter Date: 11/19/2018  Check In: Session Check In - 11/19/18 1422      Check-In   Supervising physician immediately available to respond to emergencies  See telemetry face sheet for immediately available MD    Location  [facility]    Staff Present  Russella Dar, MS, EP, [facility], Exercise Physiologist;Debra Wynetta Emery, RN, BSN    Virtual Visit  No    Medication changes reported      No    Fall or balance concerns reported     No    Tobacco Cessation  No Change    Warm-up and Cool-down  Performed as group-led instruction    Resistance Training Performed  Yes    VAD Patient?  No    PAD/SET Patient?  No      Pain Assessment   Currently in Pain?  No/denies    Pain Score  0-No pain    Multiple Pain Sites  No       Capillary Blood Glucose: No results found for this or any previous visit (from the past 24 hour(s)).    Social History   Tobacco Use  Smoking Status Former Smoker  . Packs/day: 0.50  . Years: 60.00  . Pack years: 30.00  . Types: Cigarettes  . Quit date: 07/28/2016  . Years since quitting: 2.3  Smokeless Tobacco Never Used    Goals Met:  Proper associated with RPD/PD & O2 Sat Independence with exercise equipment Improved SOB with ADL's Using PLB without cueing & demonstrates good technique Exercise tolerated well Personal goals reviewed No report of cardiac concerns or symptoms Strength training completed today  Goals Unmet:  Not Applicable  Comments: Check out: 1430   Dr. Sinda Du is Medical Director for [facility] Pulmonary Rehab.

## 2018-11-24 ENCOUNTER — Other Ambulatory Visit: Payer: Self-pay

## 2018-11-24 ENCOUNTER — Encounter (HOSPITAL_COMMUNITY)
Admission: RE | Admit: 2018-11-24 | Discharge: 2018-11-24 | Disposition: A | Discharge status: Home / Self Care | Payer: Medicare Other | Source: Ambulatory Visit | Attending: Pulmonary Disease | Admitting: Pulmonary Disease

## 2018-11-24 DIAGNOSIS — J449 Chronic obstructive pulmonary disease, unspecified: Secondary | ICD-10-CM | POA: Diagnosis not present

## 2018-11-24 NOTE — Progress Notes (Signed)
**Note Mark-Identified via Obfuscation** Daily Session Note  Patient Details  Name: [name] Cline: [id number] Date of Birth: 30-Jun-1945 Referring Provider:     PULMONARY REHAB COPD ORIENTATION from 10/14/2018 in [place]  Referring Provider  Sood      Encounter Date: 11/24/2018  Check In: Session Check In - 11/24/18 1425      Check-In   Supervising physician immediately available to respond to emergencies  See telemetry face sheet for immediately available MD    Location  [facility]    Staff Present  Russella Dar, MS, EP, [facility], Exercise Physiologist;Other    Virtual Visit  No    Medication changes reported      No    Fall or balance concerns reported     No    Tobacco Cessation  No Change    Warm-up and Cool-down  Performed as group-led instruction    Resistance Training Performed  Yes    VAD Patient?  No    PAD/SET Patient?  No      Pain Assessment   Currently in Pain?  No/denies    Pain Score  0-No pain    Multiple Pain Sites  No       Capillary Blood Glucose: No results found for this or any previous visit (from the past 24 hour(s)).    Social History   Tobacco Use  Smoking Status Former Smoker  . Packs/day: 0.50  . Years: 60.00  . Pack years: 30.00  . Types: Cigarettes  . Quit date: 07/28/2016  . Years since quitting: 2.3  Smokeless Tobacco Never Used    Goals Met:  Proper associated with RPD/PD & O2 Sat Independence with exercise equipment Improved SOB with ADL's Exercise tolerated well Personal goals reviewed Queuing for purse lip breathing No report of cardiac concerns or symptoms Strength training completed today  Goals Unmet:  Not Applicable  Comments: Pt able to follow exercise prescription today without complaint.  Will continue to monitor for progression. Check out: 11:45   Dr. Sinda Du is Medical Director for [facility] Pulmonary Rehab.

## 2018-11-26 ENCOUNTER — Encounter (HOSPITAL_COMMUNITY): Payer: Medicare Other

## 2018-11-26 NOTE — Progress Notes (Signed)
Pulmonary Individual Treatment Plan  Patient Details  Name: Mark Cline MRN: [id number] Date of Birth: 03/14/1945 Referring Provider:     PULMONARY REHAB COPD ORIENTATION from 10/14/2018 in [place]  Referring Provider  Sood      Initial Encounter Date:    PULMONARY REHAB COPD ORIENTATION from 10/14/2018 in [place]  Date  10/14/18      Visit Diagnosis: COPD with chronic bronchitis and emphysema ([place])  Patient's Home Medications on Admission:   Current Outpatient Medications:  .  ALBUTEROL IN, Inhale into the lungs as needed., Disp: , Rfl:  .  budesonide-formoterol (SYMBICORT) 160-4.5 MCG/ACT inhaler, Inhale 2 puffs into the lungs 2 (two) times daily., Disp: 1 Inhaler, Rfl: 6 .  lisinopril (PRINIVIL,ZESTRIL) 10 MG tablet, Take 10 mg by mouth every morning. , Disp: , Rfl:   Past Medical History: Past Medical History:  Diagnosis Date  . AAA (abdominal aortic aneurysm) ([place]) followed by pcp   infarenal , per CT 03-17-2017 , 4.4cm  . Arthritis    hands  . COPD with emphysema ([place])    followed by pcp  . Dyspnea on exertion   . History of amputation of finger age 42  . Hypertension   . Prostate cancer [facility]) urologist-  dr wrenn/  onologist-- dr Tammi Klippel   dx 02-28-2017-- STage T2b, Gleason 4+5, PSA 8.8-- scheduled for radiactive seed implants 07-31-2017 then IMRT  . Wears glasses   . Wears partial dentures    upper    Tobacco Use: Social History   Tobacco Use  Smoking Status Former Smoker  . Packs/day: 0.50  . Years: 60.00  . Pack years: 30.00  . Types: Cigarettes  . Quit date: 07/28/2016  . Years since quitting: 2.3  Smokeless Tobacco Never Used    Labs: Recent Review [facility] for [facility] Latest Ref Rng & Units 01/14/2014   TCO2 0 - 100 mmol/L 25      Capillary Blood Glucose: No results found for: GLUCAP   Pulmonary Assessment Scores: Pulmonary Assessment Scores     Row Name 10/14/18 1445         ADL UCSD   ADL Phase  Entry     SOB Score total  100     Rest  0     Walk  13     Stairs  5     Bath  5     Dress  5     Shop  5       CAT Score   CAT Score  20       mMRC Score   mMRC Score  3       UCSD: Self-administered rating of dyspnea associated with activities of daily living (ADLs) 6-point scale (0 = "not at all" to 5 = "maximal or unable to do because of breathlessness")  Scoring Scores range from 0 to 120.  Minimally important difference is 5 units  CAT: CAT can identify the health impairment of COPD patients and is better correlated with disease progression.  CAT has a scoring range of zero to 40. The CAT score is classified into four groups of low (less than 10), medium (10 - 20), high (21-30) and very high (31-40) based on the impact level of disease on health status. A CAT score over 10 suggests significant symptoms.  A worsening CAT score could be explained by an exacerbation, poor medication adherence, poor inhaler  technique, or progression of COPD or comorbid conditions.  CAT MCID is 2 points  mMRC: mMRC (Modified Medical Research Council) Dyspnea Scale is used to assess the degree of baseline functional disability in patients of respiratory disease due to dyspnea. No minimal important difference is established. A decrease in score of 1 point or greater is considered a positive change.   Pulmonary Function Assessment:   Exercise Target Goals: Exercise Program Goal: Individual exercise prescription set using results from initial 6 min walk test and THRR while considering  patient's activity barriers and safety.   Exercise Prescription Goal: Initial exercise prescription builds to 30-45 minutes a day of aerobic activity, 2-3 days per week.  Home exercise guidelines will be given to patient during program as part of exercise prescription that the participant will acknowledge.  Activity Barriers & Risk Stratification: Activity  Barriers & Cardiac Risk Stratification - 10/14/18 1348      Activity Barriers & Cardiac Risk Stratification   Activity Barriers  Shortness of Breath;Deconditioning    Cardiac Risk Stratification  High       6 Minute Walk: 6 Minute Walk    Row Name 10/14/18 1347         6 Minute Walk   Phase  Initial     Distance  400 feet     Walk Time  2.5 minutes     # of Rest Breaks  2     MPH  0.75     METS  1.58     RPE  17     Perceived Dyspnea   16     VO2 Peak  4.83     Symptoms  Yes (comment)     Comments  extreme SOB     Resting HR  100 bpm     Resting BP  100/64     Resting Oxygen Saturation   96 %     Exercise Oxygen Saturation  during 6 min walk  94 %     Max Ex. HR  125 bpm     Max Ex. BP  120/68     2 Minute Post BP  108/64        Oxygen Initial Assessment: Oxygen Initial Assessment - 10/14/18 1442      Home Oxygen   Home Oxygen Device  Home Concentrator;E-Tanks    Sleep Oxygen Prescription  Continuous    Liters per minute  2    Home Exercise Oxygen Prescription  None    Home at Rest Exercise Oxygen Prescription  None    Compliance with Home Oxygen Use  Yes      Initial 6 min Walk   Oxygen Used  Continuous    Liters per minute  6      Program Oxygen Prescription   Program Oxygen Prescription  Continuous    Liters per minute  6      Intervention   Short Term Goals  To learn and understand importance of maintaining oxygen saturations>88%;To learn and demonstrate proper pursed lip breathing techniques or other breathing techniques.;To learn and understand importance of monitoring SPO2 with pulse oximeter and demonstrate accurate use of the pulse oximeter.;To learn and exhibit compliance with exercise, home and travel O2 prescription    Long  Term Goals  Exhibits compliance with exercise, home and travel O2 prescription;Maintenance of O2 saturations>88%;Exhibits proper breathing techniques, such as pursed lip breathing or other method taught during program session        Oxygen Re-Evaluation: Oxygen Re-Evaluation  [place] Name 10/29/18 0913 11/26/18 1333           Program Oxygen Prescription   Program Oxygen Prescription  Continuous  Continuous      Liters per minute  4  4      FiO2%  99  99      Comments  Patient uses O2 4 to 6 L/M prn during exercise.  Patient uses O2 4 to 6 L/M prn during exercise.        Home Oxygen   Home Oxygen Device  Home Concentrator;E-Tanks  Home Concentrator;E-Tanks      Sleep Oxygen Prescription  Continuous  Continuous      Liters per minute  2  2      Home Exercise Oxygen Prescription  Continuous  Continuous      Liters per minute  4  4      Home at Rest Exercise Oxygen Prescription  Continuous  Continuous      Liters per minute  2  2      Compliance with Home Oxygen Use  Yes  Yes        Goals/Expected Outcomes   Short Term Goals  To learn and understand importance of maintaining oxygen saturations>88%;To learn and demonstrate proper pursed lip breathing techniques or other breathing techniques.;To learn and understand importance of monitoring SPO2 with pulse oximeter and demonstrate accurate use of the pulse oximeter.;To learn and exhibit compliance with exercise, home and travel O2 prescription  To learn and understand importance of maintaining oxygen saturations>88%;To learn and demonstrate proper pursed lip breathing techniques or other breathing techniques.;To learn and understand importance of monitoring SPO2 with pulse oximeter and demonstrate accurate use of the pulse oximeter.;To learn and exhibit compliance with exercise, home and travel O2 prescription      Long  Term Goals  Exhibits compliance with exercise, home and travel O2 prescription;Maintenance of O2 saturations>88%;Exhibits proper breathing techniques, such as pursed lip breathing or other method taught during program session  Exhibits compliance with exercise, home and travel O2 prescription;Maintenance of O2 saturations>88%;Exhibits proper  breathing techniques, such as pursed lip breathing or other method taught during program session      Comments  Patient is able to verbalize the importance of maintaining his O2 >88% and is able to properly demonstrate pursed lip breathing properly. We are not allowing them to use the pulse oximeter due to COVID. Will continue to monitor for progress.  Patient is able to verbalize the importance of maintaining his O2 >88% and is able to properly demonstrate pursed lip breathing properly. We are not allowing them to use the pulse oximeter due to COVID. Will continue to monitor for progress.      Goals/Expected Outcomes  Patient will continue to meet both his short and long term goals.  Patient will continue to meet both his short and long term goals.         Oxygen Discharge (Final Oxygen Re-Evaluation): Oxygen Re-Evaluation - 11/26/18 1333      Program Oxygen Prescription   Program Oxygen Prescription  Continuous    Liters per minute  4    FiO2%  99    Comments  Patient uses O2 4 to 6 L/M prn during exercise.      Home Oxygen   Home Oxygen Device  Home Concentrator;E-Tanks    Sleep Oxygen Prescription  Continuous    Liters per minute  2    Home Exercise Oxygen Prescription  Continuous    Liters per  minute  4    Home at Rest Exercise Oxygen Prescription  Continuous    Liters per minute  2    Compliance with Home Oxygen Use  Yes      Goals/Expected Outcomes   Short Term Goals  To learn and understand importance of maintaining oxygen saturations>88%;To learn and demonstrate proper pursed lip breathing techniques or other breathing techniques.;To learn and understand importance of monitoring SPO2 with pulse oximeter and demonstrate accurate use of the pulse oximeter.;To learn and exhibit compliance with exercise, home and travel O2 prescription    Long  Term Goals  Exhibits compliance with exercise, home and travel O2 prescription;Maintenance of O2 saturations>88%;Exhibits proper breathing  techniques, such as pursed lip breathing or other method taught during program session    Comments  Patient is able to verbalize the importance of maintaining his O2 >88% and is able to properly demonstrate pursed lip breathing properly. We are not allowing them to use the pulse oximeter due to COVID. Will continue to monitor for progress.    Goals/Expected Outcomes  Patient will continue to meet both his short and long term goals.       Initial Exercise Prescription: Initial Exercise Prescription - 10/14/18 1300      Date of Initial Exercise RX and Referring Provider   Date  10/14/18    Referring Provider  Sood    Expected Discharge Date  01/13/19      Oxygen   Oxygen  Continuous    Liters  6   with exertion     NuStep   Level  1    SPM  88    Minutes  17    METs  2.1      Arm/Foot Ergometer   Level  1    Watts  12    Minutes  17    METs  1.9      Prescription Details   Frequency (times per week)  3    Duration  Progress to 30 minutes of continuous aerobic without signs/symptoms of physical distress      Intensity   THRR 40-80% of Max Heartrate  [phone]    Ratings of Perceived Exertion  11-13    Perceived Dyspnea  0-4      Progression   Progression  Continue progressive overload as per policy without signs/symptoms or physical distress.      Resistance Training   Training Prescription  Yes    Weight  1    Reps  10-15       Perform Capillary Blood Glucose checks as needed.  Exercise Prescription Changes:  Exercise Prescription Changes    Row Name 10/14/18 1300 11/05/18 1300 11/26/18 1800         Response to Exercise   Blood Pressure (Admit)  100/64  138/78  124/62     Blood Pressure (Exercise)  120/68  160/74  150/80     Blood Pressure (Exit)  108/64  128/64  120/70     Heart Rate (Admit)  100 bpm  95 bpm  91 bpm     Heart Rate (Exercise)  125 bpm  109 bpm  104 bpm     Heart Rate (Exit)  110 bpm  101 bpm  106 bpm     Oxygen Saturation (Admit)  96  %  97 %  100 %     Oxygen Saturation (Exercise)  94 %  97 %  97 %     Oxygen Saturation (Exit)  96 %  99 %  99 %     Rating of Perceived Exertion (Exercise)  17  15  16      Perceived Dyspnea (Exercise)  16  14  14      Symptoms  SOB  SOB  SOB     Comments  6MWT  first two weeks of exercise   incresed workloads     Duration  Progress to 30 minutes of  aerobic without signs/symptoms of physical distress  Progress to 30 minutes of  aerobic without signs/symptoms of physical distress  Continue with 30 min of aerobic exercise without signs/symptoms of physical distress.     Intensity  THRR New [phone]  THRR unchanged  THRR unchanged       Progression   Progression  -  Continue to progress workloads to maintain intensity without signs/symptoms of physical distress.  Continue to progress workloads to maintain intensity without signs/symptoms of physical distress.     Average METs  -  2.05  2.1       Resistance Training   Training Prescription  -  Yes  Yes     Weight  -  1  2     Reps  -  10-15  10-15     Time  -  -  10 Minutes       Oxygen   Oxygen  -  Continuous  -     Liters  -  6  6       NuStep   Level  -  1  2     SPM  -  74  79     Minutes  -  22  22     METs  -  2.4  2.4       Arm Ergometer   Level  -  -  1.3     Watts  -  -  25     Minutes  -  -  17     METs  -  -  1.8       Arm/Foot Ergometer   Level  -  1  -     Watts  -  12  -     Minutes  -  22  -     METs  -  1.7  -       Home Exercise Plan   Plans to continue exercise at  -  -  Home (comment)     Frequency  -  -  Add 3 additional days to program exercise sessions.     Initial Home Exercises Provided  -  -  10/14/18        Exercise Comments:  Exercise Comments    Row Name 10/28/18 1454 11/05/18 1314 11/26/18 1819       Exercise Comments  Vishwa has attended 4 sessions so far. He is working on the Scientist, product/process development. The Nustep is the more difficult piece of equipment for him and he has to stop  several times during the 17 minutes due to shortness of breath. He has already seen some improvement though and reports that he is already starting to feel better and more energized.  Pt. is doing well in the program. He does struggle with his shortness of breath on both pieces of equipment but he works as hard as he's able.  Randall is doing well with what he is able to do. He works hard inspite of his SOB.  He attends class consistently. We will continue to progress him as he can tolerate until he graduates.        Exercise Goals and Review:  Exercise Goals    Row Name 10/14/18 1350             Exercise Goals   Increase Physical Activity  Yes       Intervention  Provide advice, education, support and counseling about physical activity/exercise needs.;Develop an individualized exercise prescription for aerobic and resistive training based on initial evaluation findings, risk stratification, comorbidities and participant's personal goals.       Expected Outcomes  Short Term: Attend rehab on a regular basis to increase amount of physical activity.;Long Term: Add in home exercise to make exercise part of routine and to increase amount of physical activity.;Long Term: Exercising regularly at least 3-5 days a week.       Increase Strength and Stamina  Yes       Intervention  Provide advice, education, support and counseling about physical activity/exercise needs.;Develop an individualized exercise prescription for aerobic and resistive training based on initial evaluation findings, risk stratification, comorbidities and participant's personal goals.       Expected Outcomes  Short Term: Increase workloads from initial exercise prescription for resistance, speed, and METs.;Short Term: Perform resistance training exercises routinely during rehab and add in resistance training at home;Long Term: Improve cardiorespiratory fitness, muscular endurance and strength as measured by increased METs and functional  capacity (6MWT)       Able to understand and use rate of perceived exertion (RPE) scale  Yes       Intervention  Provide education and explanation on how to use RPE scale       Expected Outcomes  Short Term: Able to use RPE daily in rehab to express subjective intensity level;Long Term:  Able to use RPE to guide intensity level when exercising independently       Able to understand and use Dyspnea scale  Yes       Intervention  Provide education and explanation on how to use Dyspnea scale       Expected Outcomes  Short Term: Able to use Dyspnea scale daily in rehab to express subjective sense of shortness of breath during exertion;Long Term: Able to use Dyspnea scale to guide intensity level when exercising independently       Knowledge and understanding of Target Heart Rate Range (THRR)  Yes       Intervention  Provide education and explanation of THRR including how the numbers were predicted and where they are located for reference       Expected Outcomes  Short Term: Able to state/look up THRR;Long Term: Able to use THRR to govern intensity when exercising independently;Short Term: Able to use daily as guideline for intensity in rehab       Able to check pulse independently  Yes       Intervention  Provide education and demonstration on how to check pulse in carotid and radial arteries.;Review the importance of being able to check your own pulse for safety during independent exercise       Expected Outcomes  Short Term: Able to explain why pulse checking is important during independent exercise;Long Term: Able to check pulse independently and accurately       Understanding of Exercise Prescription  Yes       Intervention  Provide education, explanation, and written materials on patient's individual exercise prescription  Expected Outcomes  Short Term: Able to explain program exercise prescription;Long Term: Able to explain home exercise prescription to exercise independently           Exercise Goals Re-Evaluation : Exercise Goals Re-Evaluation    [place] Name 10/28/18 1452 11/26/18 1815           Exercise Goal Re-Evaluation   Exercise Goals Review  Increase Physical Activity;Increase Strength and Stamina;Able to understand and use rate of perceived exertion (RPE) scale;Able to understand and use Dyspnea scale;Knowledge and understanding of Target Heart Rate Range (THRR);Able to check pulse independently;Understanding of Exercise Prescription  Increase Physical Activity;Increase Strength and Stamina;Able to understand and use Dyspnea scale      Comments  Pt. is new to the program. Although he is rather debilitated because of his shortness of breath he comes to rehab ready and willing to work the best he can.  Wil has attended 9 sessions. He is doing well in the program. He is very consistent with his attendance. He does struggle with his SOB and stops several times to catch his breath, however, he does work hard and completes each session.      Expected Outcomes  Short: increase overall activity level Long: walk to his trashcan without having to stop.  He wants to be able to take out the garbage, walk to the mailbox, and take a shower w/o SOB.         Discharge Exercise Prescription (Final Exercise Prescription Changes): Exercise Prescription Changes - 11/26/18 1800      Response to Exercise   Blood Pressure (Admit)  124/62    Blood Pressure (Exercise)  150/80    Blood Pressure (Exit)  120/70    Heart Rate (Admit)  91 bpm    Heart Rate (Exercise)  104 bpm    Heart Rate (Exit)  106 bpm    Oxygen Saturation (Admit)  100 %    Oxygen Saturation (Exercise)  97 %    Oxygen Saturation (Exit)  99 %    Rating of Perceived Exertion (Exercise)  16    Perceived Dyspnea (Exercise)  14    Symptoms  SOB    Comments  incresed workloads    Duration  Continue with 30 min of aerobic exercise without signs/symptoms of physical distress.    Intensity  THRR unchanged       Progression   Progression  Continue to progress workloads to maintain intensity without signs/symptoms of physical distress.    Average METs  2.1      Resistance Training   Training Prescription  Yes    Weight  2    Reps  10-15    Time  10 Minutes      Oxygen   Liters  6      NuStep   Level  2    SPM  79    Minutes  22    METs  2.4      Arm Ergometer   Level  1.3    Watts  25    Minutes  17    METs  1.8      Home Exercise Plan   Plans to continue exercise at  Home (comment)    Frequency  Add 3 additional days to program exercise sessions.    Initial Home Exercises Provided  10/14/18       Nutrition:  Target Goals: Understanding of nutrition guidelines, daily intake of sodium 1500mg , cholesterol 200mg , calories 30% from fat and 7% or  less from saturated fats, daily to have 5 or more servings of fruits and vegetables.  Biometrics: Pre Biometrics - 10/14/18 1355      Pre Biometrics   Height  5\' 10"  (1.778 m)    Waist Circumference  41.5 inches    Hip Circumference  40 inches    Waist to Hip Ratio  1.04 %    Triceps Skinfold  7 mm    % Body Fat  24.7 %    Grip Strength  2.2 kg    Flexibility  --   hip problems   Single Leg Stand  --   unable to perform       Nutrition Therapy Plan and Nutrition Goals: Nutrition Therapy & Goals - 11/26/18 1334      Personal Nutrition Goals   Comments  We have resumed our RD classes and are currently working on scheduling patients. Patient will be encouraged to attend. He continues to say he has not changed his diet since he started the program. Will continue to monitor for progress.      Intervention Plan   Intervention  Nutrition handout(s) given to patient.    Expected Outcomes  Short Term Goal: Understand basic principles of dietary content, such as calories, fat, sodium, cholesterol and nutrients.       Nutrition Assessments: Nutrition Assessments - 10/14/18 1450      MEDFICTS Scores   Pre Score  36        Nutrition Goals Re-Evaluation:   Nutrition Goals Discharge (Final Nutrition Goals Re-Evaluation):   Psychosocial: Target Goals: Acknowledge presence or absence of significant depression and/or stress, maximize coping skills, provide positive support system. Participant is able to verbalize types and ability to use techniques and skills needed for reducing stress and depression.  Initial Review & Psychosocial Screening: Initial Psych Review & Screening - 10/14/18 1448      Initial Review   Current issues with  Current Depression      Family Dynamics   Good Support System?  Yes      Barriers   Psychosocial barriers to participate in program  Psychosocial barriers identified (see note)   Mainly because of health and being SOB     Screening Interventions   Expected Outcomes  Short Term goal: Identification and review with participant of any Quality of Life or Depression concerns found by scoring the questionnaire.;Long Term goal: The participant improves quality of Life and PHQ9 Scores as seen by post scores and/or verbalization of changes       Quality of Life Scores: Quality of Life - 10/14/18 1413      Quality of Life   Select  Quality of Life      Quality of Life Scores   Health/Function Pre  11.63 %    Socioeconomic Pre  22.8 %    Psych/Spiritual Pre  23.14 %    Family Pre  28.8 %    GLOBAL Pre  18.36 %      Scores of 19 and below usually indicate a poorer quality of life in these areas.  A difference of  2-3 points is a clinically meaningful difference.  A difference of 2-3 points in the total score of the Quality of Life Index has been associated with significant improvement in overall quality of life, self-image, physical symptoms, and general health in studies assessing change in quality of life.   PHQ-9: Recent Review Flowsheet Data    Depression screen [facility] 2/9 10/14/2018 04/08/2017   Decreased  Interest 0 0   Down, Depressed, Hopeless 0 0   PHQ - 2 Score 0 0    Altered sleeping 0 -   Tired, decreased energy 1 -   Change in appetite 0 -   Feeling bad or failure about yourself  3 -   Trouble concentrating 0 -   Moving slowly or fidgety/restless 0 -   Suicidal thoughts 0 -   PHQ-9 Score 4 -   Difficult doing work/chores Somewhat difficult -     Interpretation of Total Score  Total Score Depression Severity:  1-4 = Minimal depression, 5-9 = Mild depression, 10-14 = Moderate depression, 15-19 = Moderately severe depression, 20-27 = Severe depression   Psychosocial Evaluation and Intervention: Psychosocial Evaluation - 10/14/18 1449      Psychosocial Evaluation & Interventions   Interventions  Encouraged to exercise with the program and follow exercise prescription    Expected Outcomes  To feel better and not be so SOB    Continue Psychosocial Services   Follow up required by staff       Psychosocial Re-Evaluation: Psychosocial Re-Evaluation    [place] Name 10/29/18 0928 11/26/18 1340           Psychosocial Re-Evaluation   Current issues with  None Identified  None Identified      Comments  Patient's initial QOL score was 18.36% and his PHQ-9 score was 4 with no psychosocial issues identified. Will continue to monitor for progress.  Patient's initial QOL score was 18.36% and his PHQ-9 score was 4 with no psychosocial issues identified. Will continue to monitor for progress.      Expected Outcomes  Patient will have no psychosocial issues identified at discharge.  Patient will have no psychosocial issues identified at discharge.      Interventions  Stress management education;Encouraged to attend Pulmonary Rehabilitation for the exercise;Relaxation education  Stress management education;Encouraged to attend Pulmonary Rehabilitation for the exercise;Relaxation education      Continue Psychosocial Services   No Follow up required  No Follow up required         Psychosocial Discharge (Final Psychosocial Re-Evaluation): Psychosocial  Re-Evaluation - 11/26/18 1340      Psychosocial Re-Evaluation   Current issues with  None Identified    Comments  Patient's initial QOL score was 18.36% and his PHQ-9 score was 4 with no psychosocial issues identified. Will continue to monitor for progress.    Expected Outcomes  Patient will have no psychosocial issues identified at discharge.    Interventions  Stress management education;Encouraged to attend Pulmonary Rehabilitation for the exercise;Relaxation education    Continue Psychosocial Services   No Follow up required        Education: Education Goals: Education classes will be provided on a weekly basis, covering required topics. Participant will state understanding/return demonstration of topics presented.  Learning Barriers/Preferences: Learning Barriers/Preferences - 10/14/18 1450      Learning Barriers/Preferences   Learning Barriers  None    Learning Preferences  Pictoral;Written Material       Education Topics: How Lungs Work and Diseases: - Discuss the anatomy of the lungs and diseases that can affect the lungs, such as COPD.   PULMONARY REHAB CHRONIC OBSTRUCTIVE PULMONARY DISEASE from 11/19/2018 in [place]  Date  10/22/18  Educator  DJ  Instruction Review Code  2- Demonstrated Understanding      Exercise: -Discuss the importance of exercise, FITT principles of exercise, normal and abnormal responses to exercise, and how  to exercise safely.   Environmental Irritants: -Discuss types of environmental irritants and how to limit exposure to environmental irritants.   Meds/Inhalers and oxygen: - Discuss respiratory medications, definition of an inhaler and oxygen, and the proper way to use an inhaler and oxygen.   Energy Saving Techniques: - Discuss methods to conserve energy and decrease shortness of breath when performing activities of daily living.    Bronchial Hygiene / Breathing Techniques: - Discuss breathing mechanics,  pursed-lip breathing technique,  proper posture, effective ways to clear airways, and other functional breathing techniques   PULMONARY REHAB CHRONIC OBSTRUCTIVE PULMONARY DISEASE from 11/19/2018 in [place]  Date  11/19/18  Educator  DC  Instruction Review Code  2- Demonstrated Understanding      Cleaning Equipment: - Provides group verbal and written instruction about the health risks of elevated stress, cause of high stress, and healthy ways to reduce stress.   Nutrition I: Fats: - Discuss the types of cholesterol, what cholesterol does to the body, and how cholesterol levels can be controlled.   Nutrition II: Labels: -Discuss the different components of food labels and how to read food labels.   Respiratory Infections: - Discuss the signs and symptoms of respiratory infections, ways to prevent respiratory infections, and the importance of seeking medical treatment when having a respiratory infection.   Stress I: Signs and Symptoms: - Discuss the causes of stress, how stress may lead to anxiety and depression, and ways to limit stress.   Stress II: Relaxation: -Discuss relaxation techniques to limit stress.   Oxygen for Home/Travel: - Discuss how to prepare for travel when on oxygen and proper ways to transport and store oxygen to ensure safety.   Knowledge Questionnaire Score: Knowledge Questionnaire Score - 10/14/18 1451      Knowledge Questionnaire Score   Pre Score  15/18       Core Components/Risk Factors/Patient Goals at Admission: Personal Goals and Risk Factors at Admission - 10/14/18 1451      Core Components/Risk Factors/Patient Goals on Admission    Weight Management  Weight Maintenance    Personal Goal Other  Yes    Personal Goal  Be able to do thing again. Get back to normal ADL's    Intervention  Attend PR 2 x week and supplement with 3 x week at home exercise.    Expected Outcomes  To be able to breather better while doing  daily task       Core Components/Risk Factors/Patient Goals Review:  Goals and Risk Factor Review    Row Name 10/29/18 9935 11/26/18 1337           Core Components/Risk Factors/Patient Goals Review   Personal Goals Review  - Be able to do things again; get back to ADL's; take out trash.  Improve shortness of breath with ADL's;Other Be able to do things; get back to ADL's; Take our trash.      Review  Patient has completed 4 sessions losing 1 lb since his initial visit. He is doing well in the program with progression. He says he is able to get around the house better and is walking better. He also states taking showers are getting better. He used to have to stop 3 to 4 times and now he only has to stop once to "catch his breath". He is very pleased with his progress with only 4 sessions. Will continue to monitor for progress.  Patient has completed 9 sessions gaining 1 lb since  last 30 day review. He continues to well in the program with progression. He continues to say he is able to do more without having to stop as much to catch his breathe. He continues to walk better and is feeling stronger. He feels like he is making progress toward meeting his personal goals. Will continue to monitor for progress.      Expected Outcomes  Patient will continue to attend sessions and complete the program meeting his personal goals.  Patient will continue to attend sessions and complete the program meeting his personal goals.         Core Components/Risk Factors/Patient Goals at Discharge (Final Review):  Goals and Risk Factor Review - 11/26/18 1337      Core Components/Risk Factors/Patient Goals Review   Personal Goals Review  Improve shortness of breath with ADL's;Other   Be able to do things; get back to ADL's; Take our trash.   Review  Patient has completed 9 sessions gaining 1 lb since last 30 day review. He continues to well in the program with progression. He continues to say he is able to do more  without having to stop as much to catch his breathe. He continues to walk better and is feeling stronger. He feels like he is making progress toward meeting his personal goals. Will continue to monitor for progress.    Expected Outcomes  Patient will continue to attend sessions and complete the program meeting his personal goals.       ITP Comments: ITP Comments    Row Name 10/14/18 1433           ITP Comments  Patient has COPD and due to extreme SOB will not be able to walk the Treadmill. Will do all seated exercises.          Comments: ITP REVIEW Pt is making expected progress toward pulmonary rehab goals after completing 9 sessions. Recommend continued exercise, life style modification, education, and utilization of breathing techniques to increase stamina and strength and decrease shortness of breath with exertion.

## 2018-12-01 ENCOUNTER — Encounter (HOSPITAL_COMMUNITY): Payer: Medicare Other

## 2018-12-03 ENCOUNTER — Encounter (HOSPITAL_COMMUNITY): Payer: Medicare Other

## 2018-12-08 ENCOUNTER — Other Ambulatory Visit: Payer: Self-pay

## 2018-12-08 ENCOUNTER — Encounter (HOSPITAL_COMMUNITY)
Admission: RE | Admit: 2018-12-08 | Discharge: 2018-12-08 | Disposition: A | Discharge status: Home / Self Care | Payer: Medicare Other | Source: Ambulatory Visit | Attending: Pulmonary Disease | Admitting: Pulmonary Disease

## 2018-12-08 DIAGNOSIS — J449 Chronic obstructive pulmonary disease, unspecified: Secondary | ICD-10-CM | POA: Diagnosis not present

## 2018-12-08 NOTE — Progress Notes (Addendum)
Daily Session Note  Patient Details  Name: Mark Cline MRN: [id number] Date of Birth: 1945-07-28 Referring Provider:     PULMONARY REHAB COPD ORIENTATION from 10/14/2018 in [place]  Referring Provider  Sood      Encounter Date: 12/08/2018  Check In:   Capillary Blood Glucose: No results found for this or any previous visit (from the past 24 hour(s)).    Social History   Tobacco Use  Smoking Status Former Smoker  . Packs/day: 0.50  . Years: 60.00  . Pack years: 30.00  . Types: Cigarettes  . Quit date: 07/28/2016  . Years since quitting: 2.3  Smokeless Tobacco Never Used    Goals Met:  Proper associated with RPD/PD & O2 Sat Independence with exercise equipment Improved SOB with ADL's Exercise tolerated well Personal goals reviewed Queuing for purse lip breathing No report of cardiac concerns or symptoms Strength training completed today  Goals Unmet:  Not Applicable  Comments: Check out: 1145   Dr. Sinda Du is Medical Director for [facility].

## 2018-12-10 ENCOUNTER — Encounter (HOSPITAL_COMMUNITY): Payer: Medicare Other

## 2018-12-15 ENCOUNTER — Other Ambulatory Visit: Payer: Self-pay

## 2018-12-15 ENCOUNTER — Encounter (HOSPITAL_COMMUNITY)
Admission: RE | Admit: 2018-12-15 | Discharge: 2018-12-15 | Disposition: A | Discharge status: Home / Self Care | Payer: Medicare Other | Source: Ambulatory Visit | Attending: Pulmonary Disease | Admitting: Pulmonary Disease

## 2018-12-15 DIAGNOSIS — J449 Chronic obstructive pulmonary disease, unspecified: Secondary | ICD-10-CM | POA: Insufficient documentation

## 2018-12-17 ENCOUNTER — Encounter (HOSPITAL_COMMUNITY)
Admission: RE | Admit: 2018-12-17 | Discharge: 2018-12-17 | Disposition: A | Discharge status: Home / Self Care | Payer: Medicare Other | Source: Ambulatory Visit | Attending: Pulmonary Disease | Admitting: Pulmonary Disease

## 2018-12-17 ENCOUNTER — Other Ambulatory Visit: Payer: Self-pay

## 2018-12-17 DIAGNOSIS — J449 Chronic obstructive pulmonary disease, unspecified: Secondary | ICD-10-CM | POA: Diagnosis not present

## 2018-12-17 NOTE — Progress Notes (Addendum)
Daily Session Note  Patient Details  Name: Mark Cline MRN: [id number] Date of Birth: [birth year] Referring Provider:     PULMONARY REHAB COPD ORIENTATION from 10/14/2018 in [place]  Referring Provider  Sood      Encounter Date: 12/17/2018  Check In: Session Check In - 12/17/18 1045      Check-In   Supervising physician immediately available to respond to emergencies  See telemetry face sheet for immediately available MD    Location  [facility]    Staff Present  Russella Dar, MS, EP, [place], Exercise Physiologist;Tanica Gaige Wynetta Emery, RN, BSN    Medication changes reported      No    Fall or balance concerns reported     No    Tobacco Cessation  No Change    Warm-up and Cool-down  Performed as group-led instruction    Resistance Training Performed  Yes    VAD Patient?  No    PAD/SET Patient?  No      Pain Assessment   Currently in Pain?  No/denies    Pain Score  0-No pain    Multiple Pain Sites  No       Capillary Blood Glucose: No results found for this or any previous visit (from the past 24 hour(s)).    Social History   Tobacco Use  Smoking Status Former Smoker  . Packs/day: 0.50  . Years: 60.00  . Pack years: 30.00  . Types: Cigarettes  . Quit date: 07/28/2016  . Years since quitting: 2.3  Smokeless Tobacco Never Used    Goals Met:  Independence with exercise equipment Exercise tolerated well No report of cardiac concerns or symptoms Strength training completed today  Goals Unmet:  Not Applicable  Comments: Check out 1145.   Dr. Sinda Du is the Pulmonary Medical Director for [facility].

## 2018-12-18 ENCOUNTER — Other Ambulatory Visit (HOSPITAL_COMMUNITY)
Admission: AD | Admit: 2018-12-18 | Discharge: 2018-12-18 | Disposition: A | Discharge status: Home / Self Care | Payer: Medicare Other | Source: Skilled Nursing Facility | Attending: Urology | Admitting: Urology

## 2018-12-18 ENCOUNTER — Ambulatory Visit (INDEPENDENT_AMBULATORY_CARE_PROVIDER_SITE_OTHER): Payer: Medicare Other | Admitting: Urology

## 2018-12-18 DIAGNOSIS — C61 Malignant neoplasm of prostate: Secondary | ICD-10-CM

## 2018-12-18 DIAGNOSIS — R3121 Asymptomatic microscopic hematuria: Secondary | ICD-10-CM | POA: Insufficient documentation

## 2018-12-18 DIAGNOSIS — Z87440 Personal history of urinary (tract) infections: Secondary | ICD-10-CM | POA: Diagnosis not present

## 2018-12-18 LAB — URINALYSIS, COMPLETE (UACMP) WITH MICROSCOPIC
Bacteria, UA: NONE SEEN
Bilirubin Urine: NEGATIVE
Glucose, UA: NEGATIVE mg/dL
Ketones, ur: NEGATIVE mg/dL
Leukocytes,Ua: NEGATIVE
Nitrite: NEGATIVE
Protein, ur: NEGATIVE mg/dL
Specific Gravity, Urine: 1.016 (ref 1.005–1.030)
pH: 5 (ref 5.0–8.0)

## 2018-12-22 ENCOUNTER — Encounter (HOSPITAL_COMMUNITY): Payer: Medicare Other

## 2018-12-23 ENCOUNTER — Other Ambulatory Visit (HOSPITAL_COMMUNITY): Payer: Self-pay | Admitting: Urology

## 2018-12-23 ENCOUNTER — Other Ambulatory Visit: Payer: Self-pay | Admitting: Urology

## 2018-12-23 DIAGNOSIS — R3129 Other microscopic hematuria: Secondary | ICD-10-CM

## 2018-12-24 ENCOUNTER — Encounter (HOSPITAL_COMMUNITY): Payer: Medicare Other

## 2018-12-24 NOTE — Progress Notes (Signed)
Pulmonary Individual Treatment Plan  Patient Details  Name: Mark Cline MRN: [id number] Date of Birth: 1945-02-26 Referring Provider:     PULMONARY REHAB COPD ORIENTATION from 10/14/2018 in [place]  Referring Provider  Sood      Initial Encounter Date:    PULMONARY REHAB COPD ORIENTATION from 10/14/2018 in [place]  Date  10/14/18      Visit Diagnosis: COPD with chronic bronchitis and emphysema ([place])  Patient's Home Medications on Admission:   Current Outpatient Medications:  .  ALBUTEROL IN, Inhale into the lungs as needed., Disp: , Rfl:  .  budesonide-formoterol (SYMBICORT) 160-4.5 MCG/ACT inhaler, Inhale 2 puffs into the lungs 2 (two) times daily., Disp: 1 Inhaler, Rfl: 6 .  lisinopril (PRINIVIL,ZESTRIL) 10 MG tablet, Take 10 mg by mouth every morning. , Disp: , Rfl:   Past Medical History: Past Medical History:  Diagnosis Date  . AAA (abdominal aortic aneurysm) ([place]) followed by pcp   infarenal , per CT 03-17-2017 , 4.4cm  . Arthritis    hands  . COPD with emphysema ([place])    followed by pcp  . Dyspnea on exertion   . History of amputation of finger age 2  . Hypertension   . Prostate cancer [facility]) urologist-  dr wrenn/  onologist-- dr Tammi Klippel   dx 02-28-2017-- STage T2b, Gleason 4+5, PSA 8.8-- scheduled for radiactive seed implants 07-31-2017 then IMRT  . Wears glasses   . Wears partial dentures    upper    Tobacco Use: Social History   Tobacco Use  Smoking Status Former Smoker  . Packs/day: 0.50  . Years: 60.00  . Pack years: 30.00  . Types: Cigarettes  . Quit date: 07/28/2016  . Years since quitting: 2.4  Smokeless Tobacco Never Used    Labs: Recent Review [facility] for [facility] Latest Ref Rng & Units 01/14/2014   TCO2 0 - 100 mmol/L 25      Capillary Blood Glucose: No results found for: GLUCAP   Pulmonary Assessment Scores: Pulmonary Assessment Scores     Row Name 10/14/18 1445         ADL UCSD   ADL Phase  Entry     SOB Score total  100     Rest  0     Walk  13     Stairs  5     Bath  5     Dress  5     Shop  5       CAT Score   CAT Score  20       mMRC Score   mMRC Score  3       UCSD: Self-administered rating of dyspnea associated with activities of daily living (ADLs) 6-point scale (0 = "not at all" to 5 = "maximal or unable to do because of breathlessness")  Scoring Scores range from 0 to 120.  Minimally important difference is 5 units  CAT: CAT can identify the health impairment of COPD patients and is better correlated with disease progression.  CAT has a scoring range of zero to 40. The CAT score is classified into four groups of low (less than 10), medium (10 - 20), high (21-30) and very high (31-40) based on the impact level of disease on health status. A CAT score over 10 suggests significant symptoms.  A worsening CAT score could be explained by an exacerbation, poor medication adherence, poor inhaler  technique, or progression of COPD or comorbid conditions.  CAT MCID is 2 points  mMRC: mMRC (Modified Medical Research Council) Dyspnea Scale is used to assess the degree of baseline functional disability in patients of respiratory disease due to dyspnea. No minimal important difference is established. A decrease in score of 1 point or greater is considered a positive change.   Pulmonary Function Assessment:   Exercise Target Goals: Exercise Program Goal: Individual exercise prescription set using results from initial 6 min walk test and THRR while considering  patient's activity barriers and safety.   Exercise Prescription Goal: Initial exercise prescription builds to 30-45 minutes a day of aerobic activity, 2-3 days per week.  Home exercise guidelines will be given to patient during program as part of exercise prescription that the participant will acknowledge.  Activity Barriers & Risk Stratification: Activity  Barriers & Cardiac Risk Stratification - 10/14/18 1348      Activity Barriers & Cardiac Risk Stratification   Activity Barriers  Shortness of Breath;Deconditioning    Cardiac Risk Stratification  High       6 Minute Walk: 6 Minute Walk    Row Name 10/14/18 1347         6 Minute Walk   Phase  Initial     Distance  400 feet     Walk Time  2.5 minutes     # of Rest Breaks  2     MPH  0.75     METS  1.58     RPE  17     Perceived Dyspnea   16     VO2 Peak  4.83     Symptoms  Yes (comment)     Comments  extreme SOB     Resting HR  100 bpm     Resting BP  100/64     Resting Oxygen Saturation   96 %     Exercise Oxygen Saturation  during 6 min walk  94 %     Max Ex. HR  125 bpm     Max Ex. BP  120/68     2 Minute Post BP  108/64        Oxygen Initial Assessment: Oxygen Initial Assessment - 10/14/18 1442      Home Oxygen   Home Oxygen Device  Home Concentrator;E-Tanks    Sleep Oxygen Prescription  Continuous    Liters per minute  2    Home Exercise Oxygen Prescription  None    Home at Rest Exercise Oxygen Prescription  None    Compliance with Home Oxygen Use  Yes      Initial 6 min Walk   Oxygen Used  Continuous    Liters per minute  6      Program Oxygen Prescription   Program Oxygen Prescription  Continuous    Liters per minute  6      Intervention   Short Term Goals  To learn and understand importance of maintaining oxygen saturations>88%;To learn and demonstrate proper pursed lip breathing techniques or other breathing techniques.;To learn and understand importance of monitoring SPO2 with pulse oximeter and demonstrate accurate use of the pulse oximeter.;To learn and exhibit compliance with exercise, home and travel O2 prescription    Long  Term Goals  Exhibits compliance with exercise, home and travel O2 prescription;Maintenance of O2 saturations>88%;Exhibits proper breathing techniques, such as pursed lip breathing or other method taught during program session        Oxygen Re-Evaluation: Oxygen Re-Evaluation  [place] Name 10/29/18 0913 11/26/18 1333 12/24/18 0754         Program Oxygen Prescription   Program Oxygen Prescription  Continuous  Continuous  Continuous     Liters per minute  4  4  4      FiO2%  99  99  99     Comments  Patient uses O2 4 to 6 L/M prn during exercise.  Patient uses O2 4 to 6 L/M prn during exercise.  -       Home Oxygen   Home Oxygen Device  Home Concentrator;E-Tanks  Home Concentrator;E-Tanks  Home Concentrator;E-Tanks     Sleep Oxygen Prescription  Continuous  Continuous  Continuous     Liters per minute  2  2  2      Home Exercise Oxygen Prescription  Continuous  Continuous  Continuous     Liters per minute  4  4  4      Home at Rest Exercise Oxygen Prescription  Continuous  Continuous  Continuous     Liters per minute  2  2  2      Compliance with Home Oxygen Use  Yes  Yes  Yes       Goals/Expected Outcomes   Short Term Goals  To learn and understand importance of maintaining oxygen saturations>88%;To learn and demonstrate proper pursed lip breathing techniques or other breathing techniques.;To learn and understand importance of monitoring SPO2 with pulse oximeter and demonstrate accurate use of the pulse oximeter.;To learn and exhibit compliance with exercise, home and travel O2 prescription  To learn and understand importance of maintaining oxygen saturations>88%;To learn and demonstrate proper pursed lip breathing techniques or other breathing techniques.;To learn and understand importance of monitoring SPO2 with pulse oximeter and demonstrate accurate use of the pulse oximeter.;To learn and exhibit compliance with exercise, home and travel O2 prescription  To learn and understand importance of maintaining oxygen saturations>88%;To learn and demonstrate proper pursed lip breathing techniques or other breathing techniques.;To learn and understand importance of monitoring SPO2 with pulse oximeter and demonstrate  accurate use of the pulse oximeter.;To learn and exhibit compliance with exercise, home and travel O2 prescription     Long  Term Goals  Exhibits compliance with exercise, home and travel O2 prescription;Maintenance of O2 saturations>88%;Exhibits proper breathing techniques, such as pursed lip breathing or other method taught during program session  Exhibits compliance with exercise, home and travel O2 prescription;Maintenance of O2 saturations>88%;Exhibits proper breathing techniques, such as pursed lip breathing or other method taught during program session  Exhibits compliance with exercise, home and travel O2 prescription;Maintenance of O2 saturations>88%;Exhibits proper breathing techniques, such as pursed lip breathing or other method taught during program session     Comments  Patient is able to verbalize the importance of maintaining his O2 >88% and is able to properly demonstrate pursed lip breathing properly. We are not allowing them to use the pulse oximeter due to COVID. Will continue to monitor for progress.  Patient is able to verbalize the importance of maintaining his O2 >88% and is able to properly demonstrate pursed lip breathing properly. We are not allowing them to use the pulse oximeter due to COVID. Will continue to monitor for progress.  Patient is able to verbalize the importance of maintaining his O2 >88% and is able to properly demonstrate pursed lip breathing properly. We are not allowing them to use the pulse oximeter due to COVID. Will continue to monitor for progress.     Goals/Expected Outcomes  Patient  will continue to meet both his short and long term goals.  Patient will continue to meet both his short and long term goals.  Patient will continue to meet both his short and long term goals.        Oxygen Discharge (Final Oxygen Re-Evaluation): Oxygen Re-Evaluation - 12/24/18 0754      Program Oxygen Prescription   Program Oxygen Prescription  Continuous    Liters per  minute  4    FiO2%  99      Home Oxygen   Home Oxygen Device  Home Concentrator;E-Tanks    Sleep Oxygen Prescription  Continuous    Liters per minute  2    Home Exercise Oxygen Prescription  Continuous    Liters per minute  4    Home at Rest Exercise Oxygen Prescription  Continuous    Liters per minute  2    Compliance with Home Oxygen Use  Yes      Goals/Expected Outcomes   Short Term Goals  To learn and understand importance of maintaining oxygen saturations>88%;To learn and demonstrate proper pursed lip breathing techniques or other breathing techniques.;To learn and understand importance of monitoring SPO2 with pulse oximeter and demonstrate accurate use of the pulse oximeter.;To learn and exhibit compliance with exercise, home and travel O2 prescription    Long  Term Goals  Exhibits compliance with exercise, home and travel O2 prescription;Maintenance of O2 saturations>88%;Exhibits proper breathing techniques, such as pursed lip breathing or other method taught during program session    Comments  Patient is able to verbalize the importance of maintaining his O2 >88% and is able to properly demonstrate pursed lip breathing properly. We are not allowing them to use the pulse oximeter due to COVID. Will continue to monitor for progress.    Goals/Expected Outcomes  Patient will continue to meet both his short and long term goals.       Initial Exercise Prescription: Initial Exercise Prescription - 10/14/18 1300      Date of Initial Exercise RX and Referring Provider   Date  10/14/18    Referring Provider  Sood    Expected Discharge Date  01/13/19      Oxygen   Oxygen  Continuous    Liters  6   with exertion     NuStep   Level  1    SPM  88    Minutes  17    METs  2.1      Arm/Foot Ergometer   Level  1    Watts  12    Minutes  17    METs  1.9      Prescription Details   Frequency (times per week)  3    Duration  Progress to 30 minutes of continuous aerobic without  signs/symptoms of physical distress      Intensity   THRR 40-80% of Max Heartrate  [phone]    Ratings of Perceived Exertion  11-13    Perceived Dyspnea  0-4      Progression   Progression  Continue progressive overload as per policy without signs/symptoms or physical distress.      Resistance Training   Training Prescription  Yes    Weight  1    Reps  10-15       Perform Capillary Blood Glucose checks as needed.  Exercise Prescription Changes:  Exercise Prescription Changes    Row Name 10/14/18 1300 11/05/18 1300 11/26/18 1800 12/23/18 0900  Response to Exercise   Blood Pressure (Admit)  100/64  138/78  124/62  178/70    Blood Pressure (Exercise)  120/68  160/74  150/80  146/60    Blood Pressure (Exit)  108/64  128/64  120/70  144/80    Heart Rate (Admit)  100 bpm  95 bpm  91 bpm  100 bpm    Heart Rate (Exercise)  125 bpm  109 bpm  104 bpm  105 bpm    Heart Rate (Exit)  110 bpm  101 bpm  106 bpm  107 bpm    Oxygen Saturation (Admit)  96 %  97 %  100 %  97 %    Oxygen Saturation (Exercise)  94 %  97 %  97 %  99 %    Oxygen Saturation (Exit)  96 %  99 %  99 %  99 %    Rating of Perceived Exertion (Exercise)  17  15  16  14     Perceived Dyspnea (Exercise)  16  14  14  14     Symptoms  SOB  SOB  SOB  SOB    Comments  6MWT  first two weeks of exercise   incresed workloads  not able to increase workload    Duration  Progress to 30 minutes of  aerobic without signs/symptoms of physical distress  Progress to 30 minutes of  aerobic without signs/symptoms of physical distress  Continue with 30 min of aerobic exercise without signs/symptoms of physical distress.  Continue with 30 min of aerobic exercise without signs/symptoms of physical distress.    Intensity  THRR New [phone]  THRR unchanged  THRR unchanged  THRR unchanged      Progression   Progression  -  Continue to progress workloads to maintain intensity without signs/symptoms of physical distress.  Continue to  progress workloads to maintain intensity without signs/symptoms of physical distress.  Continue to progress workloads to maintain intensity without signs/symptoms of physical distress.    Average METs  -  2.05  2.1  2.1      Resistance Training   Training Prescription  -  Yes  Yes  Yes    Weight  -  1  2  2     Reps  -  10-15  10-15  10-15    Time  -  -  10 Minutes  10 Minutes      Oxygen   Oxygen  -  Continuous  -  -    Liters  -  6  6  4-6      NuStep   Level  -  1  2  1     SPM  -  74  79  73    Minutes  -  22  22  22     METs  -  2.4  2.4  2.1      Arm Ergometer   Level  -  -  1.3  1.4    Watts  -  -  25  25    Minutes  -  -  17  17    METs  -  -  1.8  1.9      Arm/Foot Ergometer   Level  -  1  -  -    Watts  -  12  -  -    Minutes  -  22  -  -    METs  -  1.7  -  -  Home Exercise Plan   Plans to continue exercise at  -  -  Home (comment)  Home (comment)    Frequency  -  -  Add 3 additional days to program exercise sessions.  Add 3 additional days to program exercise sessions.    Initial Home Exercises Provided  -  -  10/14/18  10/14/18       Exercise Comments:  Exercise Comments    Row Name 10/28/18 1454 11/05/18 1314 11/26/18 1819 12/23/18 1013     Exercise Comments  Mark Cline has attended 4 sessions so far. He is working on the Scientist, product/process development. The Nustep is the more difficult piece of equipment for him and he has to stop several times during the 17 minutes due to shortness of breath. He has already seen some improvement though and reports that he is already starting to feel better and more energized.  Pt. is doing well in the program. He does struggle with his shortness of breath on both pieces of equipment but he works as hard as he's able.  Aja is doing well with what he is able to do. He works hard inspite of his SOB. He attends class consistently. We will continue to progress him as he can tolerate until he graduates.  Mark Cline has been coming faithfully. He  really has a hard time exercising, however he does it anyway and will stop to catch his breath. He has recently been sick so he has not been attending as regularily. The nustep is the hardest equipment for him. Since he gets so SOB we are not able to progress him on the Nustep. Will continute to progress him on Arm Crank as tolerated.       Exercise Goals and Review:  Exercise Goals    Row Name 10/14/18 1350             Exercise Goals   Increase Physical Activity  Yes       Intervention  Provide advice, education, support and counseling about physical activity/exercise needs.;Develop an individualized exercise prescription for aerobic and resistive training based on initial evaluation findings, risk stratification, comorbidities and participant's personal goals.       Expected Outcomes  Short Term: Attend rehab on a regular basis to increase amount of physical activity.;Long Term: Add in home exercise to make exercise part of routine and to increase amount of physical activity.;Long Term: Exercising regularly at least 3-5 days a week.       Increase Strength and Stamina  Yes       Intervention  Provide advice, education, support and counseling about physical activity/exercise needs.;Develop an individualized exercise prescription for aerobic and resistive training based on initial evaluation findings, risk stratification, comorbidities and participant's personal goals.       Expected Outcomes  Short Term: Increase workloads from initial exercise prescription for resistance, speed, and METs.;Short Term: Perform resistance training exercises routinely during rehab and add in resistance training at home;Long Term: Improve cardiorespiratory fitness, muscular endurance and strength as measured by increased METs and functional capacity (6MWT)       Able to understand and use rate of perceived exertion (RPE) scale  Yes       Intervention  Provide education and explanation on how to use RPE scale        Expected Outcomes  Short Term: Able to use RPE daily in rehab to express subjective intensity level;Long Term:  Able to use RPE to guide intensity level when exercising  independently       Able to understand and use Dyspnea scale  Yes       Intervention  Provide education and explanation on how to use Dyspnea scale       Expected Outcomes  Short Term: Able to use Dyspnea scale daily in rehab to express subjective sense of shortness of breath during exertion;Long Term: Able to use Dyspnea scale to guide intensity level when exercising independently       Knowledge and understanding of Target Heart Rate Range (THRR)  Yes       Intervention  Provide education and explanation of THRR including how the numbers were predicted and where they are located for reference       Expected Outcomes  Short Term: Able to state/look up THRR;Long Term: Able to use THRR to govern intensity when exercising independently;Short Term: Able to use daily as guideline for intensity in rehab       Able to check pulse independently  Yes       Intervention  Provide education and demonstration on how to check pulse in carotid and radial arteries.;Review the importance of being able to check your own pulse for safety during independent exercise       Expected Outcomes  Short Term: Able to explain why pulse checking is important during independent exercise;Long Term: Able to check pulse independently and accurately       Understanding of Exercise Prescription  Yes       Intervention  Provide education, explanation, and written materials on patient's individual exercise prescription       Expected Outcomes  Short Term: Able to explain program exercise prescription;Long Term: Able to explain home exercise prescription to exercise independently          Exercise Goals Re-Evaluation : Exercise Goals Re-Evaluation    Row Name 10/28/18 1452 11/26/18 1815 12/23/18 1007         Exercise Goal Re-Evaluation   Exercise Goals Review   Increase Physical Activity;Increase Strength and Stamina;Able to understand and use rate of perceived exertion (RPE) scale;Able to understand and use Dyspnea scale;Knowledge and understanding of Target Heart Rate Range (THRR);Able to check pulse independently;Understanding of Exercise Prescription  Increase Physical Activity;Increase Strength and Stamina;Able to understand and use Dyspnea scale  Increase Physical Activity;Increase Strength and Stamina     Comments  Pt. is new to the program. Although he is rather debilitated because of his shortness of breath he comes to rehab ready and willing to work the best he can.  Mark Cline has attended 9 sessions. He is doing well in the program. He is very consistent with his attendance. He does struggle with his SOB and stops several times to catch his breath, however, he does work hard and completes each session.  Patient is wanting to do mor things again, to be able to take the garbage out, take a shower and go to the mailbox without getting extremely SOB     Expected Outcomes  Short: increase overall activity level Long: walk to his trashcan without having to stop.  He wants to be able to take out the garbage, walk to the mailbox, and take a shower w/o SOB.  To be able to get back to his normal ADL's w/o SOB.        Discharge Exercise Prescription (Final Exercise Prescription Changes): Exercise Prescription Changes - 12/23/18 0900      Response to Exercise   Blood Pressure (Admit)  178/70    Blood  Pressure (Exercise)  146/60    Blood Pressure (Exit)  144/80    Heart Rate (Admit)  100 bpm    Heart Rate (Exercise)  105 bpm    Heart Rate (Exit)  107 bpm    Oxygen Saturation (Admit)  97 %    Oxygen Saturation (Exercise)  99 %    Oxygen Saturation (Exit)  99 %    Rating of Perceived Exertion (Exercise)  14    Perceived Dyspnea (Exercise)  14    Symptoms  SOB    Comments  not able to increase workload    Duration  Continue with 30 min of aerobic exercise  without signs/symptoms of physical distress.    Intensity  THRR unchanged      Progression   Progression  Continue to progress workloads to maintain intensity without signs/symptoms of physical distress.    Average METs  2.1      Resistance Training   Training Prescription  Yes    Weight  2    Reps  10-15    Time  10 Minutes      Oxygen   Liters  4-6      NuStep   Level  1    SPM  73    Minutes  22    METs  2.1      Arm Ergometer   Level  1.4    Watts  25    Minutes  17    METs  1.9      Home Exercise Plan   Plans to continue exercise at  Home (comment)    Frequency  Add 3 additional days to program exercise sessions.    Initial Home Exercises Provided  10/14/18       Nutrition:  Target Goals: Understanding of nutrition guidelines, daily intake of sodium 1500mg , cholesterol 200mg , calories 30% from fat and 7% or less from saturated fats, daily to have 5 or more servings of fruits and vegetables.  Biometrics: Pre Biometrics - 10/14/18 1355      Pre Biometrics   Height  5\' 10"  (1.778 m)    Waist Circumference  41.5 inches    Hip Circumference  40 inches    Waist to Hip Ratio  1.04 %    Triceps Skinfold  7 mm    % Body Fat  24.7 %    Grip Strength  2.2 kg    Flexibility  --   hip problems   Single Leg Stand  --   unable to perform       Nutrition Therapy Plan and Nutrition Goals: Nutrition Therapy & Goals - 12/24/18 0755      Personal Nutrition Goals   Comments  We have resumed our RD classes and are currently working on scheduling patients. Patient scored 36 on his initial medficts diet assessment.  He continues to say he has not changed his diet since he started the program. Will continue to monitor for progress.      Intervention Plan   Intervention  Nutrition handout(s) given to patient.    Expected Outcomes  Short Term Goal: Understand basic principles of dietary content, such as calories, fat, sodium, cholesterol and nutrients.       Nutrition  Assessments: Nutrition Assessments - 10/14/18 1450      MEDFICTS Scores   Pre Score  36       Nutrition Goals Re-Evaluation:   Nutrition Goals Discharge (Final Nutrition Goals Re-Evaluation):   Psychosocial: Target Goals: Acknowledge presence  or absence of significant depression and/or stress, maximize coping skills, provide positive support system. Participant is able to verbalize types and ability to use techniques and skills needed for reducing stress and depression.  Initial Review & Psychosocial Screening: Initial Psych Review & Screening - 10/14/18 1448      Initial Review   Current issues with  Current Depression      Family Dynamics   Good Support System?  Yes      Barriers   Psychosocial barriers to participate in program  Psychosocial barriers identified (see note)   Mainly because of health and being SOB     Screening Interventions   Expected Outcomes  Short Term goal: Identification and review with participant of any Quality of Life or Depression concerns found by scoring the questionnaire.;Long Term goal: The participant improves quality of Life and PHQ9 Scores as seen by post scores and/or verbalization of changes       Quality of Life Scores: Quality of Life - 10/14/18 1413      Quality of Life   Select  Quality of Life      Quality of Life Scores   Health/Function Pre  11.63 %    Socioeconomic Pre  22.8 %    Psych/Spiritual Pre  23.14 %    Family Pre  28.8 %    GLOBAL Pre  18.36 %      Scores of 19 and below usually indicate a poorer quality of life in these areas.  A difference of  2-3 points is a clinically meaningful difference.  A difference of 2-3 points in the total score of the Quality of Life Index has been associated with significant improvement in overall quality of life, self-image, physical symptoms, and general health in studies assessing change in quality of life.   PHQ-9: Recent Review Flowsheet Data    Depression screen [facility] 2/9  10/14/2018 04/08/2017   Decreased Interest 0 0   Down, Depressed, Hopeless 0 0   PHQ - 2 Score 0 0   Altered sleeping 0 -   Tired, decreased energy 1 -   Change in appetite 0 -   Feeling bad or failure about yourself  3 -   Trouble concentrating 0 -   Moving slowly or fidgety/restless 0 -   Suicidal thoughts 0 -   PHQ-9 Score 4 -   Difficult doing work/chores Somewhat difficult -     Interpretation of Total Score  Total Score Depression Severity:  1-4 = Minimal depression, 5-9 = Mild depression, 10-14 = Moderate depression, 15-19 = Moderately severe depression, 20-27 = Severe depression   Psychosocial Evaluation and Intervention: Psychosocial Evaluation - 10/14/18 1449      Psychosocial Evaluation & Interventions   Interventions  Encouraged to exercise with the program and follow exercise prescription    Expected Outcomes  To feel better and not be so SOB    Continue Psychosocial Services   Follow up required by staff       Psychosocial Re-Evaluation: Psychosocial Re-Evaluation    Row Name 10/29/18 0928 11/26/18 1340 12/24/18 0800         Psychosocial Re-Evaluation   Current issues with  None Identified  None Identified  None Identified     Comments  Patient's initial QOL score was 18.36% and his PHQ-9 score was 4 with no psychosocial issues identified. Will continue to monitor for progress.  Patient's initial QOL score was 18.36% and his PHQ-9 score was 4 with no psychosocial issues identified.  Will continue to monitor for progress.  Patient's initial QOL score was 18.36% and his PHQ-9 score was 4 with no psychosocial issues identified. Will continue to monitor for progress.     Expected Outcomes  Patient will have no psychosocial issues identified at discharge.  Patient will have no psychosocial issues identified at discharge.  Patient will have no psychosocial issues identified at discharge.     Interventions  Stress management education;Encouraged to attend Pulmonary  Rehabilitation for the exercise;Relaxation education  Stress management education;Encouraged to attend Pulmonary Rehabilitation for the exercise;Relaxation education  Stress management education;Encouraged to attend Pulmonary Rehabilitation for the exercise;Relaxation education     Continue Psychosocial Services   No Follow up required  No Follow up required  No Follow up required        Psychosocial Discharge (Final Psychosocial Re-Evaluation): Psychosocial Re-Evaluation - 12/24/18 0800      Psychosocial Re-Evaluation   Current issues with  None Identified    Comments  Patient's initial QOL score was 18.36% and his PHQ-9 score was 4 with no psychosocial issues identified. Will continue to monitor for progress.    Expected Outcomes  Patient will have no psychosocial issues identified at discharge.    Interventions  Stress management education;Encouraged to attend Pulmonary Rehabilitation for the exercise;Relaxation education    Continue Psychosocial Services   No Follow up required        Education: Education Goals: Education classes will be provided on a weekly basis, covering required topics. Participant will state understanding/return demonstration of topics presented.  Learning Barriers/Preferences: Learning Barriers/Preferences - 10/14/18 1450      Learning Barriers/Preferences   Learning Barriers  None    Learning Preferences  Pictoral;Written Material       Education Topics: How Lungs Work and Diseases: - Discuss the anatomy of the lungs and diseases that can affect the lungs, such as COPD.   PULMONARY REHAB CHRONIC OBSTRUCTIVE PULMONARY DISEASE from 12/17/2018 in [place]  Date  10/22/18  Educator  DJ  Instruction Review Code  2- Demonstrated Understanding      Exercise: -Discuss the importance of exercise, FITT principles of exercise, normal and abnormal responses to exercise, and how to exercise safely.   Environmental Irritants: -Discuss  types of environmental irritants and how to limit exposure to environmental irritants.   Meds/Inhalers and oxygen: - Discuss respiratory medications, definition of an inhaler and oxygen, and the proper way to use an inhaler and oxygen.   Energy Saving Techniques: - Discuss methods to conserve energy and decrease shortness of breath when performing activities of daily living.    Bronchial Hygiene / Breathing Techniques: - Discuss breathing mechanics, pursed-lip breathing technique,  proper posture, effective ways to clear airways, and other functional breathing techniques   PULMONARY REHAB CHRONIC OBSTRUCTIVE PULMONARY DISEASE from 12/17/2018 in [place]  Date  11/19/18  Educator  DC  Instruction Review Code  2- Demonstrated Understanding      Cleaning Equipment: - Provides group verbal and written instruction about the health risks of elevated stress, cause of high stress, and healthy ways to reduce stress.   Nutrition I: Fats: - Discuss the types of cholesterol, what cholesterol does to the body, and how cholesterol levels can be controlled.   Nutrition II: Labels: -Discuss the different components of food labels and how to read food labels.   Respiratory Infections: - Discuss the signs and symptoms of respiratory infections, ways to prevent respiratory infections, and the importance of seeking  medical treatment when having a respiratory infection.   PULMONARY REHAB CHRONIC OBSTRUCTIVE PULMONARY DISEASE from 12/17/2018 in [place]  Date  12/17/18  Educator  DWynetta Emery  Instruction Review Code  2- Demonstrated Understanding      Stress I: Signs and Symptoms: - Discuss the causes of stress, how stress may lead to anxiety and depression, and ways to limit stress.   Stress II: Relaxation: -Discuss relaxation techniques to limit stress.   Oxygen for Home/Travel: - Discuss how to prepare for travel when on oxygen and proper  ways to transport and store oxygen to ensure safety.   Knowledge Questionnaire Score: Knowledge Questionnaire Score - 10/14/18 1451      Knowledge Questionnaire Score   Pre Score  15/18       Core Components/Risk Factors/Patient Goals at Admission: Personal Goals and Risk Factors at Admission - 10/14/18 1451      Core Components/Risk Factors/Patient Goals on Admission    Weight Management  Weight Maintenance    Personal Goal Other  Yes    Personal Goal  Be able to do thing again. Get back to normal ADL's    Intervention  Attend PR 2 x week and supplement with 3 x week at home exercise.    Expected Outcomes  To be able to breather better while doing daily task       Core Components/Risk Factors/Patient Goals Review:  Goals and Risk Factor Review    Row Name 10/29/18 4650 11/26/18 1337 12/24/18 0756         Core Components/Risk Factors/Patient Goals Review   Personal Goals Review  - Be able to do things again; get back to ADL's; take out trash.  Improve shortness of breath with ADL's;Other Be able to do things; get back to ADL's; Take our trash.  Improve shortness of breath with ADL's;Other Be able to do things again; get back to ADL's; take trash out.     Review  Patient has completed 4 sessions losing 1 lb since his initial visit. He is doing well in the program with progression. He says he is able to get around the house better and is walking better. He also states taking showers are getting better. He used to have to stop 3 to 4 times and now he only has to stop once to "catch his breath". He is very pleased with his progress with only 4 sessions. Will continue to monitor for progress.  Patient has completed 9 sessions gaining 1 lb since last 30 day review. He continues to well in the program with progression. He continues to say he is able to do more without having to stop as much to catch his breathe. He continues to walk better and is feeling stronger. He feels like he is making  progress toward meeting his personal goals. Will continue to monitor for progress.  Patient has completed 12 sessions losing 1 lb since last 30 day review. He continues to do well in the program with some progression. He has missed several sessions during this review period due to having a UTI and the subsequent treatment causng general malaise. Hopefully, he will be able to return to the program and continue to work toward meeting his personal goals.     Expected Outcomes  Patient will continue to attend sessions and complete the program meeting his personal goals.  Patient will continue to attend sessions and complete the program meeting his personal goals.  Patient will continue to attend sessions  and complete the program meeting his personal goals.        Core Components/Risk Factors/Patient Goals at Discharge (Final Review):  Goals and Risk Factor Review - 12/24/18 0756      Core Components/Risk Factors/Patient Goals Review   Personal Goals Review  Improve shortness of breath with ADL's;Other   Be able to do things again; get back to ADL's; take trash out.   Review  Patient has completed 12 sessions losing 1 lb since last 30 day review. He continues to do well in the program with some progression. He has missed several sessions during this review period due to having a UTI and the subsequent treatment causng general malaise. Hopefully, he will be able to return to the program and continue to work toward meeting his personal goals.    Expected Outcomes  Patient will continue to attend sessions and complete the program meeting his personal goals.       ITP Comments: ITP Comments    Row Name 10/14/18 1433           ITP Comments  Patient has COPD and due to extreme SOB will not be able to walk the Treadmill. Will do all seated exercises.          Comments: ITP REVIEW Pt is making expected progress toward pulmonary rehab goals after completing 12 sessions. Recommend continued exercise,  life style modification, education, and utilization of breathing techniques to increase stamina and strength and decrease shortness of breath with exertion.

## 2018-12-28 NOTE — Progress Notes (Signed)
Daily Session Note  Patient Details  Name: MAHKI SPIKES MRN: [id number] Date of Birth: [birth year] Referring Provider:     PULMONARY REHAB COPD ORIENTATION from 10/14/2018 in [place]  Referring Provider  Sood      Encounter Date: 12/15/2018  Check In:   Capillary Blood Glucose: No results found for this or any previous visit (from the past 24 hour(s)).    Social History   Tobacco Use  Smoking Status Former Smoker  . Packs/day: 0.50  . Years: 60.00  . Pack years: 30.00  . Types: Cigarettes  . Quit date: 07/28/2016  . Years since quitting: 2.4  Smokeless Tobacco Never Used    Goals Met:  Proper associated with RPD/PD & O2 Sat Independence with exercise equipment Using PLB without cueing & demonstrates good technique Exercise tolerated well Queuing for purse lip breathing No report of cardiac concerns or symptoms  Goals Unmet:  Not Applicable  Comments: Check out 1145.   Dr. Sinda Du is Medical Director for [facility].

## 2018-12-29 ENCOUNTER — Encounter (HOSPITAL_COMMUNITY): Payer: Medicare Other

## 2018-12-31 ENCOUNTER — Encounter (HOSPITAL_COMMUNITY): Payer: Medicare Other

## 2019-01-05 ENCOUNTER — Encounter (HOSPITAL_COMMUNITY): Payer: Medicare Other

## 2019-01-07 ENCOUNTER — Encounter (HOSPITAL_COMMUNITY): Payer: Medicare Other

## 2019-01-12 ENCOUNTER — Other Ambulatory Visit: Payer: Self-pay

## 2019-01-12 ENCOUNTER — Encounter (HOSPITAL_COMMUNITY): Payer: Medicare Other

## 2019-01-12 ENCOUNTER — Ambulatory Visit (HOSPITAL_COMMUNITY)
Admission: RE | Admit: 2019-01-12 | Discharge: 2019-01-12 | Disposition: A | Discharge status: Home / Self Care | Payer: Medicare Other | Source: Ambulatory Visit | Attending: Urology | Admitting: Urology

## 2019-01-12 DIAGNOSIS — R3129 Other microscopic hematuria: Secondary | ICD-10-CM | POA: Insufficient documentation

## 2019-01-12 LAB — POCT I-STAT CREATININE: Creatinine, Ser: 0.8 mg/dL (ref 0.61–1.24)

## 2019-01-12 MED ORDER — IOHEXOL 300 MG/ML  SOLN
150.0000 mL | Freq: Once | INTRAMUSCULAR | Status: AC | PRN
  Administered 2019-01-12: 125 mL via INTRAVENOUS

## 2019-01-14 ENCOUNTER — Encounter (HOSPITAL_COMMUNITY): Payer: Medicare Other

## 2019-01-18 ENCOUNTER — Telehealth (HOSPITAL_COMMUNITY): Payer: Self-pay

## 2019-01-18 NOTE — Telephone Encounter (Signed)
Called patient regarding Pulmonary Rehab closing. Spoke with his wife. He is not interested in the virtual program but would like to come back to complete the program when we reopen. He has completed 12 sessions. Will follow when we reopen.

## 2019-01-19 ENCOUNTER — Encounter (HOSPITAL_COMMUNITY): Payer: Medicare Other

## 2019-01-21 ENCOUNTER — Encounter (HOSPITAL_COMMUNITY): Payer: Medicare Other

## 2019-01-22 NOTE — Addendum Note (Signed)
Encounter addended by: Dwana Melena, RN on: 01/22/2019 1:50 PM  Actions taken: Flowsheet accepted, Clinical Note Signed

## 2019-01-22 NOTE — Progress Notes (Signed)
Pulmonary Individual Treatment Plan  Patient Details  Name: Mark Cline MRN: [id number] Date of Birth: [birth year] Referring Provider:     PULMONARY REHAB COPD ORIENTATION from 10/14/2018 in [place]  Referring Provider  Sood      Initial Encounter Date:    PULMONARY REHAB COPD ORIENTATION from 10/14/2018 in [place]  Date  10/14/18      Visit Diagnosis: COPD with chronic bronchitis and emphysema ([place])  Patient's Home Medications on Admission:   Current Outpatient Medications:  .  ALBUTEROL IN, Inhale into the lungs as needed., Disp: , Rfl:  .  budesonide-formoterol (SYMBICORT) 160-4.5 MCG/ACT inhaler, Inhale 2 puffs into the lungs 2 (two) times daily., Disp: 1 Inhaler, Rfl: 6 .  lisinopril (PRINIVIL,ZESTRIL) 10 MG tablet, Take 10 mg by mouth every morning. , Disp: , Rfl:   Past Medical History: Past Medical History:  Diagnosis Date  . AAA (abdominal aortic aneurysm) ([place]) followed by pcp   infarenal , per CT 03-17-2017 , 4.4cm  . Arthritis    hands  . COPD with emphysema ([place])    followed by pcp  . Dyspnea on exertion   . History of amputation of finger age 86  . Hypertension   . Prostate cancer [facility]) urologist-  dr wrenn/  onologist-- dr Tammi Klippel   dx 02-28-2017-- STage T2b, Gleason 4+5, PSA 8.8-- scheduled for radiactive seed implants 07-31-2017 then IMRT  . Wears glasses   . Wears partial dentures    upper    Tobacco Use: Social History   Tobacco Use  Smoking Status Former Smoker  . Packs/day: 0.50  . Years: 60.00  . Pack years: 30.00  . Types: Cigarettes  . Quit date: 07/28/2016  . Years since quitting: 2.4  Smokeless Tobacco Never Used    Labs: Recent Review [facility] Latest Ref Rng & Units 01/14/2014   TCO2 0 - 100 mmol/L 25      Capillary Blood Glucose: No results found for: GLUCAP   Pulmonary Assessment Scores: Pulmonary Assessment Scores     Row Name 10/14/18 1445         ADL UCSD   ADL Phase  Entry     SOB Score total  100     Rest  0     Walk  13     Stairs  5     Bath  5     Dress  5     Shop  5       CAT Score   CAT Score  20       mMRC Score   mMRC Score  3       UCSD: Self-administered rating of dyspnea associated with activities of daily living (ADLs) 6-point scale (0 = "not at all" to 5 = "maximal or unable to do because of breathlessness")  Scoring Scores range from 0 to 120.  Minimally important difference is 5 units  CAT: CAT can identify the health impairment of COPD patients and is better correlated with disease progression.  CAT has a scoring range of zero to 40. The CAT score is classified into four groups of low (less than 10), medium (10 - 20), high (21-30) and very high (31-40) based on the impact level of disease on health status. A CAT score over 10 suggests significant symptoms.  A worsening CAT score could be explained by an exacerbation, poor medication adherence, poor inhaler  technique, or progression of COPD or comorbid conditions.  CAT MCID is 2 points  mMRC: mMRC (Modified Medical Research Council) Dyspnea Scale is used to assess the degree of baseline functional disability in patients of respiratory disease due to dyspnea. No minimal important difference is established. A decrease in score of 1 point or greater is considered a positive change.   Pulmonary Function Assessment:   Exercise Target Goals: Exercise Program Goal: Individual exercise prescription set using results from initial 6 min walk test and THRR while considering  patient's activity barriers and safety.   Exercise Prescription Goal: Initial exercise prescription builds to 30-45 minutes a day of aerobic activity, 2-3 days per week.  Home exercise guidelines will be given to patient during program as part of exercise prescription that the participant will acknowledge.  Activity Barriers & Risk Stratification: Activity  Barriers & Cardiac Risk Stratification - 10/14/18 1348      Activity Barriers & Cardiac Risk Stratification   Activity Barriers  Shortness of Breath;Deconditioning    Cardiac Risk Stratification  High       6 Minute Walk: 6 Minute Walk    Row Name 10/14/18 1347         6 Minute Walk   Phase  Initial     Distance  400 feet     Walk Time  2.5 minutes     # of Rest Breaks  2     MPH  0.75     METS  1.58     RPE  17     Perceived Dyspnea   16     VO2 Peak  4.83     Symptoms  Yes (comment)     Comments  extreme SOB     Resting HR  100 bpm     Resting BP  100/64     Resting Oxygen Saturation   96 %     Exercise Oxygen Saturation  during 6 min walk  94 %     Max Ex. HR  125 bpm     Max Ex. BP  120/68     2 Minute Post BP  108/64        Oxygen Initial Assessment: Oxygen Initial Assessment - 10/14/18 1442      Home Oxygen   Home Oxygen Device  Home Concentrator;E-Tanks    Sleep Oxygen Prescription  Continuous    Liters per minute  2    Home Exercise Oxygen Prescription  None    Home at Rest Exercise Oxygen Prescription  None    Compliance with Home Oxygen Use  Yes      Initial 6 min Walk   Oxygen Used  Continuous    Liters per minute  6      Program Oxygen Prescription   Program Oxygen Prescription  Continuous    Liters per minute  6      Intervention   Short Term Goals  To learn and understand importance of maintaining oxygen saturations>88%;To learn and demonstrate proper pursed lip breathing techniques or other breathing techniques.;To learn and understand importance of monitoring SPO2 with pulse oximeter and demonstrate accurate use of the pulse oximeter.;To learn and exhibit compliance with exercise, home and travel O2 prescription    Long  Term Goals  Exhibits compliance with exercise, home and travel O2 prescription;Maintenance of O2 saturations>88%;Exhibits proper breathing techniques, such as pursed lip breathing or other method taught during program session        Oxygen Re-Evaluation: Oxygen Re-Evaluation  [place] Name 10/29/18 0913 11/26/18 1333 12/24/18 0754         Program Oxygen Prescription   Program Oxygen Prescription  Continuous  Continuous  Continuous     Liters per minute  4  4  4      FiO2%  99  99  99     Comments  Patient uses O2 4 to 6 L/M prn during exercise.  Patient uses O2 4 to 6 L/M prn during exercise.  --       Home Oxygen   Home Oxygen Device  Home Concentrator;E-Tanks  Home Concentrator;E-Tanks  Home Concentrator;E-Tanks     Sleep Oxygen Prescription  Continuous  Continuous  Continuous     Liters per minute  2  2  2      Home Exercise Oxygen Prescription  Continuous  Continuous  Continuous     Liters per minute  4  4  4      Home at Rest Exercise Oxygen Prescription  Continuous  Continuous  Continuous     Liters per minute  2  2  2      Compliance with Home Oxygen Use  Yes  Yes  Yes       Goals/Expected Outcomes   Short Term Goals  To learn and understand importance of maintaining oxygen saturations>88%;To learn and demonstrate proper pursed lip breathing techniques or other breathing techniques.;To learn and understand importance of monitoring SPO2 with pulse oximeter and demonstrate accurate use of the pulse oximeter.;To learn and exhibit compliance with exercise, home and travel O2 prescription  To learn and understand importance of maintaining oxygen saturations>88%;To learn and demonstrate proper pursed lip breathing techniques or other breathing techniques.;To learn and understand importance of monitoring SPO2 with pulse oximeter and demonstrate accurate use of the pulse oximeter.;To learn and exhibit compliance with exercise, home and travel O2 prescription  To learn and understand importance of maintaining oxygen saturations>88%;To learn and demonstrate proper pursed lip breathing techniques or other breathing techniques.;To learn and understand importance of monitoring SPO2 with pulse oximeter and demonstrate  accurate use of the pulse oximeter.;To learn and exhibit compliance with exercise, home and travel O2 prescription     Long  Term Goals  Exhibits compliance with exercise, home and travel O2 prescription;Maintenance of O2 saturations>88%;Exhibits proper breathing techniques, such as pursed lip breathing or other method taught during program session  Exhibits compliance with exercise, home and travel O2 prescription;Maintenance of O2 saturations>88%;Exhibits proper breathing techniques, such as pursed lip breathing or other method taught during program session  Exhibits compliance with exercise, home and travel O2 prescription;Maintenance of O2 saturations>88%;Exhibits proper breathing techniques, such as pursed lip breathing or other method taught during program session     Comments  Patient is able to verbalize the importance of maintaining his O2 >88% and is able to properly demonstrate pursed lip breathing properly. We are not allowing them to use the pulse oximeter due to COVID. Will continue to monitor for progress.  Patient is able to verbalize the importance of maintaining his O2 >88% and is able to properly demonstrate pursed lip breathing properly. We are not allowing them to use the pulse oximeter due to COVID. Will continue to monitor for progress.  Patient is able to verbalize the importance of maintaining his O2 >88% and is able to properly demonstrate pursed lip breathing properly. We are not allowing them to use the pulse oximeter due to COVID. Will continue to monitor for progress.     Goals/Expected Outcomes  Patient  will continue to meet both his short and long term goals.  Patient will continue to meet both his short and long term goals.  Patient will continue to meet both his short and long term goals.        Oxygen Discharge (Final Oxygen Re-Evaluation): Oxygen Re-Evaluation - 12/24/18 0754      Program Oxygen Prescription   Program Oxygen Prescription  Continuous    Liters per  minute  4    FiO2%  99      Home Oxygen   Home Oxygen Device  Home Concentrator;E-Tanks    Sleep Oxygen Prescription  Continuous    Liters per minute  2    Home Exercise Oxygen Prescription  Continuous    Liters per minute  4    Home at Rest Exercise Oxygen Prescription  Continuous    Liters per minute  2    Compliance with Home Oxygen Use  Yes      Goals/Expected Outcomes   Short Term Goals  To learn and understand importance of maintaining oxygen saturations>88%;To learn and demonstrate proper pursed lip breathing techniques or other breathing techniques.;To learn and understand importance of monitoring SPO2 with pulse oximeter and demonstrate accurate use of the pulse oximeter.;To learn and exhibit compliance with exercise, home and travel O2 prescription    Long  Term Goals  Exhibits compliance with exercise, home and travel O2 prescription;Maintenance of O2 saturations>88%;Exhibits proper breathing techniques, such as pursed lip breathing or other method taught during program session    Comments  Patient is able to verbalize the importance of maintaining his O2 >88% and is able to properly demonstrate pursed lip breathing properly. We are not allowing them to use the pulse oximeter due to COVID. Will continue to monitor for progress.    Goals/Expected Outcomes  Patient will continue to meet both his short and long term goals.       Initial Exercise Prescription: Initial Exercise Prescription - 10/14/18 1300      Date of Initial Exercise RX and Referring Provider   Date  10/14/18    Referring Provider  Sood    Expected Discharge Date  01/13/19      Oxygen   Oxygen  Continuous    Liters  6   with exertion     NuStep   Level  1    SPM  88    Minutes  17    METs  2.1      Arm/Foot Ergometer   Level  1    Watts  12    Minutes  17    METs  1.9      Prescription Details   Frequency (times per week)  3    Duration  Progress to 30 minutes of continuous aerobic without  signs/symptoms of physical distress      Intensity   THRR 40-80% of Max Heartrate  [phone]    Ratings of Perceived Exertion  11-13    Perceived Dyspnea  0-4      Progression   Progression  Continue progressive overload as per policy without signs/symptoms or physical distress.      Resistance Training   Training Prescription  Yes    Weight  1    Reps  10-15       Perform Capillary Blood Glucose checks as needed.  Exercise Prescription Changes:  Exercise Prescription Changes    Row Name 10/14/18 1300 11/05/18 1300 11/26/18 1800 12/23/18 0900  Response to Exercise   Blood Pressure (Admit)  100/64  138/78  124/62  178/70    Blood Pressure (Exercise)  120/68  160/74  150/80  146/60    Blood Pressure (Exit)  108/64  128/64  120/70  144/80    Heart Rate (Admit)  100 bpm  95 bpm  91 bpm  100 bpm    Heart Rate (Exercise)  125 bpm  109 bpm  104 bpm  105 bpm    Heart Rate (Exit)  110 bpm  101 bpm  106 bpm  107 bpm    Oxygen Saturation (Admit)  96 %  97 %  100 %  97 %    Oxygen Saturation (Exercise)  94 %  97 %  97 %  99 %    Oxygen Saturation (Exit)  96 %  99 %  99 %  99 %    Rating of Perceived Exertion (Exercise)  17  15  16  14     Perceived Dyspnea (Exercise)  16  14  14  14     Symptoms  SOB  SOB  SOB  SOB    Comments  6MWT  first two weeks of exercise   incresed workloads  not able to increase workload    Duration  Progress to 30 minutes of  aerobic without signs/symptoms of physical distress  Progress to 30 minutes of  aerobic without signs/symptoms of physical distress  Continue with 30 min of aerobic exercise without signs/symptoms of physical distress.  Continue with 30 min of aerobic exercise without signs/symptoms of physical distress.    Intensity  THRR New [phone]  THRR unchanged  THRR unchanged  THRR unchanged      Progression   Progression  --  Continue to progress workloads to maintain intensity without signs/symptoms of physical distress.  Continue to  progress workloads to maintain intensity without signs/symptoms of physical distress.  Continue to progress workloads to maintain intensity without signs/symptoms of physical distress.    Average METs  --  2.05  2.1  2.1      Resistance Training   Training Prescription  --  Yes  Yes  Yes    Weight  --  1  2  2     Reps  --  10-15  10-15  10-15    Time  --  --  10 Minutes  10 Minutes      Oxygen   Oxygen  --  Continuous  --  --    Liters  --  6  6  4-6      NuStep   Level  --  1  2  1     SPM  --  74  79  73    Minutes  --  22  22  22     METs  --  2.4  2.4  2.1      Arm Ergometer   Level  --  --  1.3  1.4    Watts  --  --  25  25    Minutes  --  --  17  17    METs  --  --  1.8  1.9      Arm/Foot Ergometer   Level  --  1  --  --    Watts  --  12  --  --    Minutes  --  22  --  --    METs  --  1.7  --  --  Home Exercise Plan   Plans to continue exercise at  --  --  Home (comment)  Home (comment)    Frequency  --  --  Add 3 additional days to program exercise sessions.  Add 3 additional days to program exercise sessions.    Initial Home Exercises Provided  --  --  10/14/18  10/14/18       Exercise Comments:  Exercise Comments    Row Name 10/28/18 1454 11/05/18 1314 11/26/18 1819 12/23/18 1013     Exercise Comments  Mark Cline has attended 4 sessions so far. He is working on the Scientist, product/process development. The Nustep is the more difficult piece of equipment for him and he has to stop several times during the 17 minutes due to shortness of breath. He has already seen some improvement though and reports that he is already starting to feel better and more energized.  Pt. is doing well in the program. He does struggle with his shortness of breath on both pieces of equipment but he works as hard as he's able.  Mark Cline is doing well with what he is able to do. He works hard inspite of his SOB. He attends class consistently. We will continue to progress him as he can tolerate until he  graduates.  Mark Cline has been coming faithfully. He really has a hard time exercising, however he does it anyway and will stop to catch his breath. He has recently been sick so he has not been attending as regularily. The nustep is the hardest equipment for him. Since he gets so SOB we are not able to progress him on the Nustep. Will continute to progress him on Arm Crank as tolerated.       Exercise Goals and Review:  Exercise Goals    Row Name 10/14/18 1350             Exercise Goals   Increase Physical Activity  Yes       Intervention  Provide advice, education, support and counseling about physical activity/exercise needs.;Develop an individualized exercise prescription for aerobic and resistive training based on initial evaluation findings, risk stratification, comorbidities and participant's personal goals.       Expected Outcomes  Short Term: Attend rehab on a regular basis to increase amount of physical activity.;Long Term: Add in home exercise to make exercise part of routine and to increase amount of physical activity.;Long Term: Exercising regularly at least 3-5 days a week.       Increase Strength and Stamina  Yes       Intervention  Provide advice, education, support and counseling about physical activity/exercise needs.;Develop an individualized exercise prescription for aerobic and resistive training based on initial evaluation findings, risk stratification, comorbidities and participant's personal goals.       Expected Outcomes  Short Term: Increase workloads from initial exercise prescription for resistance, speed, and METs.;Short Term: Perform resistance training exercises routinely during rehab and add in resistance training at home;Long Term: Improve cardiorespiratory fitness, muscular endurance and strength as measured by increased METs and functional capacity (6MWT)       Able to understand and use rate of perceived exertion (RPE) scale  Yes       Intervention  Provide  education and explanation on how to use RPE scale       Expected Outcomes  Short Term: Able to use RPE daily in rehab to express subjective intensity level;Long Term:  Able to use RPE to guide intensity level when exercising  independently       Able to understand and use Dyspnea scale  Yes       Intervention  Provide education and explanation on how to use Dyspnea scale       Expected Outcomes  Short Term: Able to use Dyspnea scale daily in rehab to express subjective sense of shortness of breath during exertion;Long Term: Able to use Dyspnea scale to guide intensity level when exercising independently       Knowledge and understanding of Target Heart Rate Range (THRR)  Yes       Intervention  Provide education and explanation of THRR including how the numbers were predicted and where they are located for reference       Expected Outcomes  Short Term: Able to state/look up THRR;Long Term: Able to use THRR to govern intensity when exercising independently;Short Term: Able to use daily as guideline for intensity in rehab       Able to check pulse independently  Yes       Intervention  Provide education and demonstration on how to check pulse in carotid and radial arteries.;Review the importance of being able to check your own pulse for safety during independent exercise       Expected Outcomes  Short Term: Able to explain why pulse checking is important during independent exercise;Long Term: Able to check pulse independently and accurately       Understanding of Exercise Prescription  Yes       Intervention  Provide education, explanation, and written materials on patient's individual exercise prescription       Expected Outcomes  Short Term: Able to explain program exercise prescription;Long Term: Able to explain home exercise prescription to exercise independently          Exercise Goals Re-Evaluation : Exercise Goals Re-Evaluation    Row Name 10/28/18 1452 11/26/18 1815 12/23/18 1007          Exercise Goal Re-Evaluation   Exercise Goals Review  Increase Physical Activity;Increase Strength and Stamina;Able to understand and use rate of perceived exertion (RPE) scale;Able to understand and use Dyspnea scale;Knowledge and understanding of Target Heart Rate Range (THRR);Able to check pulse independently;Understanding of Exercise Prescription  Increase Physical Activity;Increase Strength and Stamina;Able to understand and use Dyspnea scale  Increase Physical Activity;Increase Strength and Stamina     Comments  Pt. is new to the program. Although he is rather debilitated because of his shortness of breath he comes to rehab ready and willing to work the best he can.  Mark Cline has attended 9 sessions. He is doing well in the program. He is very consistent with his attendance. He does struggle with his SOB and stops several times to catch his breath, however, he does work hard and completes each session.  Patient is wanting to do mor things again, to be able to take the garbage out, take a shower and go to the mailbox without getting extremely SOB     Expected Outcomes  Short: increase overall activity level Long: walk to his trashcan without having to stop.  He wants to be able to take out the garbage, walk to the mailbox, and take a shower w/o SOB.  To be able to get back to his normal ADL's w/o SOB.        Discharge Exercise Prescription (Final Exercise Prescription Changes): Exercise Prescription Changes - 12/23/18 0900      Response to Exercise   Blood Pressure (Admit)  178/70    Blood  Pressure (Exercise)  146/60    Blood Pressure (Exit)  144/80    Heart Rate (Admit)  100 bpm    Heart Rate (Exercise)  105 bpm    Heart Rate (Exit)  107 bpm    Oxygen Saturation (Admit)  97 %    Oxygen Saturation (Exercise)  99 %    Oxygen Saturation (Exit)  99 %    Rating of Perceived Exertion (Exercise)  14    Perceived Dyspnea (Exercise)  14    Symptoms  SOB    Comments  not able to increase workload     Duration  Continue with 30 min of aerobic exercise without signs/symptoms of physical distress.    Intensity  THRR unchanged      Progression   Progression  Continue to progress workloads to maintain intensity without signs/symptoms of physical distress.    Average METs  2.1      Resistance Training   Training Prescription  Yes    Weight  2    Reps  10-15    Time  10 Minutes      Oxygen   Liters  4-6      NuStep   Level  1    SPM  73    Minutes  22    METs  2.1      Arm Ergometer   Level  1.4    Watts  25    Minutes  17    METs  1.9      Home Exercise Plan   Plans to continue exercise at  Home (comment)    Frequency  Add 3 additional days to program exercise sessions.    Initial Home Exercises Provided  10/14/18       Nutrition:  Target Goals: Understanding of nutrition guidelines, daily intake of sodium 1500mg , cholesterol 200mg , calories 30% from fat and 7% or less from saturated fats, daily to have 5 or more servings of fruits and vegetables.  Biometrics: Pre Biometrics - 10/14/18 1355      Pre Biometrics   Height  5\' 10"  (1.778 m)    Waist Circumference  41.5 inches    Hip Circumference  40 inches    Waist to Hip Ratio  1.04 %    Triceps Skinfold  7 mm    % Body Fat  24.7 %    Grip Strength  2.2 kg    Flexibility  --   hip problems   Single Leg Stand  --   unable to perform       Nutrition Therapy Plan and Nutrition Goals: Nutrition Therapy & Goals - 12/24/18 0755      Personal Nutrition Goals   Comments  We have resumed our RD classes and are currently working on scheduling patients. Patient scored 36 on his initial medficts diet assessment.  He continues to say he has not changed his diet since he started the program. Will continue to monitor for progress.      Intervention Plan   Intervention  Nutrition handout(s) given to patient.    Expected Outcomes  Short Term Goal: Understand basic principles of dietary content, such as calories, fat,  sodium, cholesterol and nutrients.       Nutrition Assessments: Nutrition Assessments - 10/14/18 1450      MEDFICTS Scores   Pre Score  36       Nutrition Goals Re-Evaluation:   Nutrition Goals Discharge (Final Nutrition Goals Re-Evaluation):   Psychosocial: Target Goals: Acknowledge presence  or absence of significant depression and/or stress, maximize coping skills, provide positive support system. Participant is able to verbalize types and ability to use techniques and skills needed for reducing stress and depression.  Initial Review & Psychosocial Screening: Initial Psych Review & Screening - 10/14/18 1448      Initial Review   Current issues with  Current Depression      Family Dynamics   Good Support System?  Yes      Barriers   Psychosocial barriers to participate in program  Psychosocial barriers identified (see note)   Mainly because of health and being SOB     Screening Interventions   Expected Outcomes  Short Term goal: Identification and review with participant of any Quality of Life or Depression concerns found by scoring the questionnaire.;Long Term goal: The participant improves quality of Life and PHQ9 Scores as seen by post scores and/or verbalization of changes       Quality of Life Scores: Quality of Life - 10/14/18 1413      Quality of Life   Select  Quality of Life      Quality of Life Scores   Health/Function Pre  11.63 %    Socioeconomic Pre  22.8 %    Psych/Spiritual Pre  23.14 %    Family Pre  28.8 %    GLOBAL Pre  18.36 %      Scores of 19 and below usually indicate a poorer quality of life in these areas.  A difference of  2-3 points is a clinically meaningful difference.  A difference of 2-3 points in the total score of the Quality of Life Index has been associated with significant improvement in overall quality of life, self-image, physical symptoms, and general health in studies assessing change in quality of life.   PHQ-9: Recent  Review Flowsheet Data    Depression screen [facility] 2/9 10/14/2018 04/08/2017   Decreased Interest 0 0   Down, Depressed, Hopeless 0 0   PHQ - 2 Score 0 0   Altered sleeping 0 -   Tired, decreased energy 1 -   Change in appetite 0 -   Feeling bad or failure about yourself  3 -   Trouble concentrating 0 -   Moving slowly or fidgety/restless 0 -   Suicidal thoughts 0 -   PHQ-9 Score 4 -   Difficult doing work/chores Somewhat difficult -     Interpretation of Total Score  Total Score Depression Severity:  1-4 = Minimal depression, 5-9 = Mild depression, 10-14 = Moderate depression, 15-19 = Moderately severe depression, 20-27 = Severe depression   Psychosocial Evaluation and Intervention: Psychosocial Evaluation - 10/14/18 1449      Psychosocial Evaluation & Interventions   Interventions  Encouraged to exercise with the program and follow exercise prescription    Expected Outcomes  To feel better and not be so SOB    Continue Psychosocial Services   Follow up required by staff       Psychosocial Re-Evaluation: Psychosocial Re-Evaluation    Row Name 10/29/18 0928 11/26/18 1340 12/24/18 0800         Psychosocial Re-Evaluation   Current issues with  None Identified  None Identified  None Identified     Comments  Patient's initial QOL score was 18.36% and his PHQ-9 score was 4 with no psychosocial issues identified. Will continue to monitor for progress.  Patient's initial QOL score was 18.36% and his PHQ-9 score was 4 with no psychosocial issues identified.  Will continue to monitor for progress.  Patient's initial QOL score was 18.36% and his PHQ-9 score was 4 with no psychosocial issues identified. Will continue to monitor for progress.     Expected Outcomes  Patient will have no psychosocial issues identified at discharge.  Patient will have no psychosocial issues identified at discharge.  Patient will have no psychosocial issues identified at discharge.     Interventions  Stress  management education;Encouraged to attend Pulmonary Rehabilitation for the exercise;Relaxation education  Stress management education;Encouraged to attend Pulmonary Rehabilitation for the exercise;Relaxation education  Stress management education;Encouraged to attend Pulmonary Rehabilitation for the exercise;Relaxation education     Continue Psychosocial Services   No Follow up required  No Follow up required  No Follow up required        Psychosocial Discharge (Final Psychosocial Re-Evaluation): Psychosocial Re-Evaluation - 12/24/18 0800      Psychosocial Re-Evaluation   Current issues with  None Identified    Comments  Patient's initial QOL score was 18.36% and his PHQ-9 score was 4 with no psychosocial issues identified. Will continue to monitor for progress.    Expected Outcomes  Patient will have no psychosocial issues identified at discharge.    Interventions  Stress management education;Encouraged to attend Pulmonary Rehabilitation for the exercise;Relaxation education    Continue Psychosocial Services   No Follow up required        Education: Education Goals: Education classes will be provided on a weekly basis, covering required topics. Participant will state understanding/return demonstration of topics presented.  Learning Barriers/Preferences: Learning Barriers/Preferences - 10/14/18 1450      Learning Barriers/Preferences   Learning Barriers  None    Learning Preferences  Pictoral;Written Material       Education Topics: How Lungs Work and Diseases: - Discuss the anatomy of the lungs and diseases that can affect the lungs, such as COPD.   PULMONARY REHAB CHRONIC OBSTRUCTIVE PULMONARY DISEASE from 12/17/2018 in [place]  Date  10/22/18  Educator  DJ  Instruction Review Code  2- Demonstrated Understanding      Exercise: -Discuss the importance of exercise, FITT principles of exercise, normal and abnormal responses to exercise, and how to  exercise safely.   Environmental Irritants: -Discuss types of environmental irritants and how to limit exposure to environmental irritants.   Meds/Inhalers and oxygen: - Discuss respiratory medications, definition of an inhaler and oxygen, and the proper way to use an inhaler and oxygen.   Energy Saving Techniques: - Discuss methods to conserve energy and decrease shortness of breath when performing activities of daily living.    Bronchial Hygiene / Breathing Techniques: - Discuss breathing mechanics, pursed-lip breathing technique,  proper posture, effective ways to clear airways, and other functional breathing techniques   PULMONARY REHAB CHRONIC OBSTRUCTIVE PULMONARY DISEASE from 12/17/2018 in [place]  Date  11/19/18  Educator  DC  Instruction Review Code  2- Demonstrated Understanding      Cleaning Equipment: - Provides group verbal and written instruction about the health risks of elevated stress, cause of high stress, and healthy ways to reduce stress.   Nutrition I: Fats: - Discuss the types of cholesterol, what cholesterol does to the body, and how cholesterol levels can be controlled.   Nutrition II: Labels: -Discuss the different components of food labels and how to read food labels.   Respiratory Infections: - Discuss the signs and symptoms of respiratory infections, ways to prevent respiratory infections, and the importance of seeking  medical treatment when having a respiratory infection.   PULMONARY REHAB CHRONIC OBSTRUCTIVE PULMONARY DISEASE from 12/17/2018 in [place]  Date  12/17/18  Educator  DWynetta Emery  Instruction Review Code  2- Demonstrated Understanding      Stress I: Signs and Symptoms: - Discuss the causes of stress, how stress may lead to anxiety and depression, and ways to limit stress.   Stress II: Relaxation: -Discuss relaxation techniques to limit stress.   Oxygen for Home/Travel: -  Discuss how to prepare for travel when on oxygen and proper ways to transport and store oxygen to ensure safety.   Knowledge Questionnaire Score: Knowledge Questionnaire Score - 10/14/18 1451      Knowledge Questionnaire Score   Pre Score  15/18       Core Components/Risk Factors/Patient Goals at Admission: Personal Goals and Risk Factors at Admission - 10/14/18 1451      Core Components/Risk Factors/Patient Goals on Admission    Weight Management  Weight Maintenance    Personal Goal Other  Yes    Personal Goal  Be able to do thing again. Get back to normal ADL's    Intervention  Attend PR 2 x week and supplement with 3 x week at home exercise.    Expected Outcomes  To be able to breather better while doing daily task       Core Components/Risk Factors/Patient Goals Review:  Goals and Risk Factor Review    Row Name 10/29/18 9381 11/26/18 1337 12/24/18 0756         Core Components/Risk Factors/Patient Goals Review   Personal Goals Review  -- Be able to do things again; get back to ADL's; take out trash.  Improve shortness of breath with ADL's;Other Be able to do things; get back to ADL's; Take our trash.  Improve shortness of breath with ADL's;Other Be able to do things again; get back to ADL's; take trash out.     Review  Patient has completed 4 sessions losing 1 lb since his initial visit. He is doing well in the program with progression. He says he is able to get around the house better and is walking better. He also states taking showers are getting better. He used to have to stop 3 to 4 times and now he only has to stop once to "catch his breath". He is very pleased with his progress with only 4 sessions. Will continue to monitor for progress.  Patient has completed 9 sessions gaining 1 lb since last 30 day review. He continues to well in the program with progression. He continues to say he is able to do more without having to stop as much to catch his breathe. He continues to walk  better and is feeling stronger. He feels like he is making progress toward meeting his personal goals. Will continue to monitor for progress.  Patient has completed 12 sessions losing 1 lb since last 30 day review. He continues to do well in the program with some progression. He has missed several sessions during this review period due to having a UTI and the subsequent treatment causng general malaise. Hopefully, he will be able to return to the program and continue to work toward meeting his personal goals.     Expected Outcomes  Patient will continue to attend sessions and complete the program meeting his personal goals.  Patient will continue to attend sessions and complete the program meeting his personal goals.  Patient will continue to attend sessions  and complete the program meeting his personal goals.        Core Components/Risk Factors/Patient Goals at Discharge (Final Review):  Goals and Risk Factor Review - 12/24/18 0756      Core Components/Risk Factors/Patient Goals Review   Personal Goals Review  Improve shortness of breath with ADL's;Other   Be able to do things again; get back to ADL's; take trash out.   Review  Patient has completed 12 sessions losing 1 lb since last 30 day review. He continues to do well in the program with some progression. He has missed several sessions during this review period due to having a UTI and the subsequent treatment causng general malaise. Hopefully, he will be able to return to the program and continue to work toward meeting his personal goals.    Expected Outcomes  Patient will continue to attend sessions and complete the program meeting his personal goals.       ITP Comments: ITP Comments    Row Name 10/14/18 1433 01/22/19 1348         ITP Comments  Patient has COPD and due to extreme SOB will not be able to walk the Treadmill. Will do all seated exercises.  Pulmonary rehab has been suspended due to department closure and staff reassignment  secondary to [place]. Patient wants to return and complete the program when we reopen. He is not interested in participating in the [facility] virtual program. Will f/u when we reopen.         Comments: ITP REVIEW Pulmonary rehab has been suspended due to department closure and staff reassignment secondary to [place]. Patient wants to return and complete the program when we reopen. He is not interested in participating in the [facility] virtual program. Will f/u when we reopen

## 2019-01-25 ENCOUNTER — Other Ambulatory Visit: Payer: Self-pay

## 2019-01-25 DIAGNOSIS — C61 Malignant neoplasm of prostate: Secondary | ICD-10-CM

## 2019-01-26 ENCOUNTER — Encounter (HOSPITAL_COMMUNITY): Payer: Medicare Other

## 2019-01-28 ENCOUNTER — Encounter (HOSPITAL_COMMUNITY): Payer: Medicare Other

## 2019-02-02 ENCOUNTER — Encounter (HOSPITAL_COMMUNITY): Payer: Medicare Other

## 2019-02-02 DIAGNOSIS — E7800 Pure hypercholesterolemia, unspecified: Secondary | ICD-10-CM | POA: Diagnosis not present

## 2019-02-02 DIAGNOSIS — I10 Essential (primary) hypertension: Secondary | ICD-10-CM | POA: Diagnosis not present

## 2019-02-02 DIAGNOSIS — J449 Chronic obstructive pulmonary disease, unspecified: Secondary | ICD-10-CM | POA: Diagnosis not present

## 2019-02-04 ENCOUNTER — Encounter (HOSPITAL_COMMUNITY): Payer: Medicare Other

## 2019-02-05 DIAGNOSIS — J449 Chronic obstructive pulmonary disease, unspecified: Secondary | ICD-10-CM | POA: Diagnosis not present

## 2019-02-05 IMAGING — NM NM BONE WHOLE BODY
4 series · 4 of 4 positions shown · non-contrast
Comparison: Pelvis CT 03/17/2017

CLINICAL DATA: 71-year-old male diagnosed with prostate cancer last
month. Staging. Denies bone pain.

EXAM:
NUCLEAR MEDICINE WHOLE BODY BONE SCAN
TECHNIQUE: Whole body anterior and posterior images were obtained approximately
3 hours after intravenous injection of radiopharmaceutical.
RADIOPHARMACEUTICALS:  20.5 mCi Zechnetium-EEm MDP IV

[Series 1: wbr_bone_60 whole body · 2.66mm/px · 1 of 1 slices shown (1 of 2)]
[im 1/1]
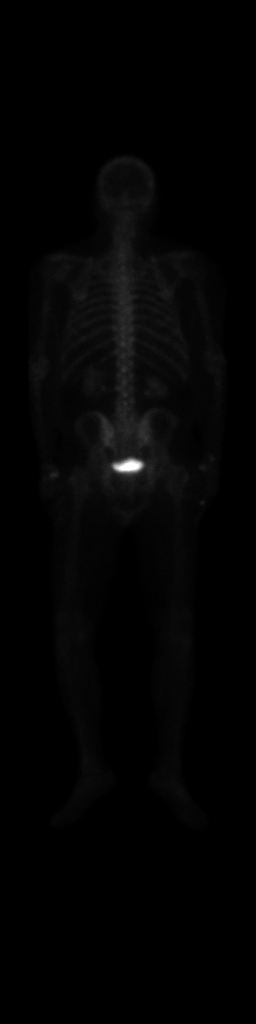

[Series 1: whole body · 2.66mm/px · 1 of 1 slices shown (1 of 2)]
[im 1/1]
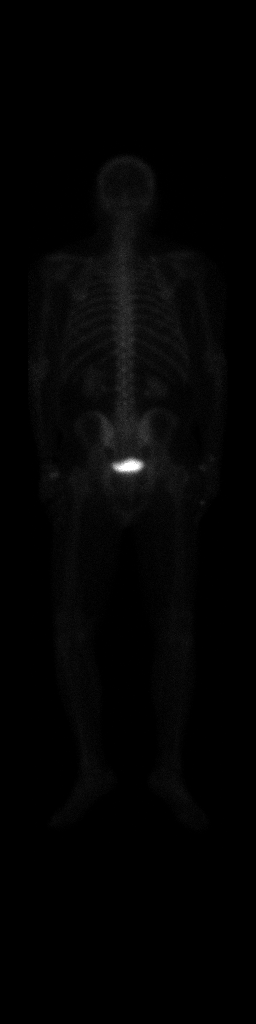

[Series 1: wbr_bone_60 whole body · 2.66mm/px · 1 of 1 slices shown (2 of 2)]
[im 1/1]
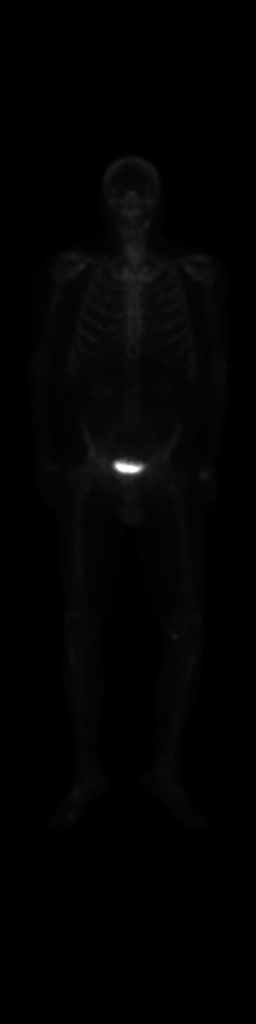

[Series 1: whole body · 2.66mm/px · 1 of 1 slices shown (2 of 2)]
[im 1/1]
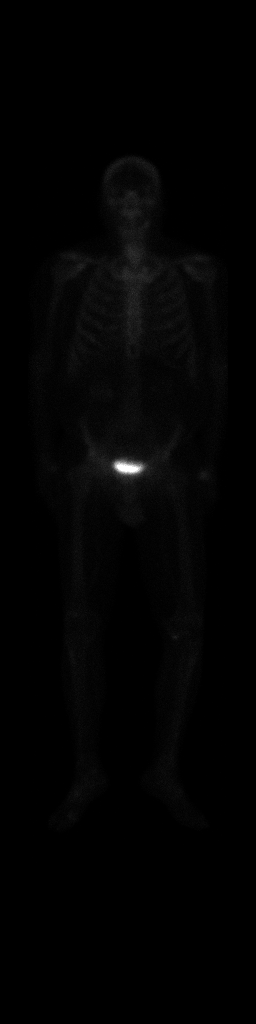

[4 of 4 positions shown; findings below may reference images not displayed]

FINDINGS: Expected radiotracer activity in both kidneys in the urinary
bladder.

Homogeneous radiotracer activity throughout the axial skeleton
including the skull.

Degenerative appearing radiotracer activity about both thumbs,
knees, and in the right foot. No suspicious radiotracer activity
identified in the appendicular skeleton.
IMPRESSION: No findings specific for metastatic disease to bone.

## 2019-02-09 ENCOUNTER — Encounter (HOSPITAL_COMMUNITY): Payer: Medicare Other

## 2019-02-11 ENCOUNTER — Encounter (HOSPITAL_COMMUNITY): Payer: Medicare Other

## 2019-02-16 ENCOUNTER — Encounter (HOSPITAL_COMMUNITY): Payer: Medicare Other

## 2019-02-18 ENCOUNTER — Encounter (HOSPITAL_COMMUNITY): Payer: Medicare Other

## 2019-02-18 NOTE — Progress Notes (Signed)
Pulmonary Individual Treatment Plan  Patient Details  Name: Mark Cline MRN: [id number] Date of Birth: 1945/03/29 Referring Provider:     PULMONARY REHAB COPD ORIENTATION from 10/14/2018 in [place]  Referring Provider  Sood      Initial Encounter Date:    PULMONARY REHAB COPD ORIENTATION from 10/14/2018 in [place]  Date  10/14/18      Visit Diagnosis: COPD with chronic bronchitis and emphysema ([place])  Patient's Home Medications on Admission:   Current Outpatient Medications:  .  ALBUTEROL IN, Inhale into the lungs as needed., Disp: , Rfl:  .  budesonide-formoterol (SYMBICORT) 160-4.5 MCG/ACT inhaler, Inhale 2 puffs into the lungs 2 (two) times daily., Disp: 1 Inhaler, Rfl: 6 .  lisinopril (PRINIVIL,ZESTRIL) 10 MG tablet, Take 10 mg by mouth every morning. , Disp: , Rfl:   Past Medical History: Past Medical History:  Diagnosis Date  . AAA (abdominal aortic aneurysm) ([place]) followed by pcp   infarenal , per CT 03-17-2017 , 4.4cm  . Arthritis    hands  . COPD with emphysema ([place])    followed by pcp  . Dyspnea on exertion   . History of amputation of finger age 20  . Hypertension   . Prostate cancer [facility]) urologist-  dr wrenn/  onologist-- dr Tammi Klippel   dx 02-28-2017-- STage T2b, Gleason 4+5, PSA 8.8-- scheduled for radiactive seed implants 07-31-2017 then IMRT  . Wears glasses   . Wears partial dentures    upper    Tobacco Use: Social History   Tobacco Use  Smoking Status Former Smoker  . Packs/day: 0.50  . Years: 60.00  . Pack years: 30.00  . Types: Cigarettes  . Quit date: 07/28/2016  . Years since quitting: 2.5  Smokeless Tobacco Never Used    Labs: Recent Review [facility] for [facility] Latest Ref Rng & Units 01/14/2014   TCO2 0 - 100 mmol/L 25      Capillary Blood Glucose: No results found for: GLUCAP   Pulmonary Assessment Scores: Pulmonary Assessment Scores     Row Name 10/14/18 1445         ADL UCSD   ADL Phase  Entry     SOB Score total  100     Rest  0     Walk  13     Stairs  5     Bath  5     Dress  5     Shop  5       CAT Score   CAT Score  20       mMRC Score   mMRC Score  3       UCSD: Self-administered rating of dyspnea associated with activities of daily living (ADLs) 6-point scale (0 = "not at all" to 5 = "maximal or unable to do because of breathlessness")  Scoring Scores range from 0 to 120.  Minimally important difference is 5 units  CAT: CAT can identify the health impairment of COPD patients and is better correlated with disease progression.  CAT has a scoring range of zero to 40. The CAT score is classified into four groups of low (less than 10), medium (10 - 20), high (21-30) and very high (31-40) based on the impact level of disease on health status. A CAT score over 10 suggests significant symptoms.  A worsening CAT score could be explained by an exacerbation, poor medication adherence, poor inhaler  technique, or progression of COPD or comorbid conditions.  CAT MCID is 2 points  mMRC: mMRC (Modified Medical Research Council) Dyspnea Scale is used to assess the degree of baseline functional disability in patients of respiratory disease due to dyspnea. No minimal important difference is established. A decrease in score of 1 point or greater is considered a positive change.   Pulmonary Function Assessment:   Exercise Target Goals: Exercise Program Goal: Individual exercise prescription set using results from initial 6 min walk test and THRR while considering  patient's activity barriers and safety.   Exercise Prescription Goal: Initial exercise prescription builds to 30-45 minutes a day of aerobic activity, 2-3 days per week.  Home exercise guidelines will be given to patient during program as part of exercise prescription that the participant will acknowledge.  Activity Barriers & Risk Stratification: Activity  Barriers & Cardiac Risk Stratification - 10/14/18 1348      Activity Barriers & Cardiac Risk Stratification   Activity Barriers  Shortness of Breath;Deconditioning    Cardiac Risk Stratification  High       6 Minute Walk: 6 Minute Walk    Row Name 10/14/18 1347         6 Minute Walk   Phase  Initial     Distance  400 feet     Walk Time  2.5 minutes     # of Rest Breaks  2     MPH  0.75     METS  1.58     RPE  17     Perceived Dyspnea   16     VO2 Peak  4.83     Symptoms  Yes (comment)     Comments  extreme SOB     Resting HR  100 bpm     Resting BP  100/64     Resting Oxygen Saturation   96 %     Exercise Oxygen Saturation  during 6 min walk  94 %     Max Ex. HR  125 bpm     Max Ex. BP  120/68     2 Minute Post BP  108/64        Oxygen Initial Assessment: Oxygen Initial Assessment - 10/14/18 1442      Home Oxygen   Home Oxygen Device  Home Concentrator;E-Tanks    Sleep Oxygen Prescription  Continuous    Liters per minute  2    Home Exercise Oxygen Prescription  None    Home at Rest Exercise Oxygen Prescription  None    Compliance with Home Oxygen Use  Yes      Initial 6 min Walk   Oxygen Used  Continuous    Liters per minute  6      Program Oxygen Prescription   Program Oxygen Prescription  Continuous    Liters per minute  6      Intervention   Short Term Goals  To learn and understand importance of maintaining oxygen saturations>88%;To learn and demonstrate proper pursed lip breathing techniques or other breathing techniques.;To learn and understand importance of monitoring SPO2 with pulse oximeter and demonstrate accurate use of the pulse oximeter.;To learn and exhibit compliance with exercise, home and travel O2 prescription    Long  Term Goals  Exhibits compliance with exercise, home and travel O2 prescription;Maintenance of O2 saturations>88%;Exhibits proper breathing techniques, such as pursed lip breathing or other method taught during program session        Oxygen Re-Evaluation: Oxygen Re-Evaluation  [place] Name 10/29/18 0913 11/26/18 1333 12/24/18 0754         Program Oxygen Prescription   Program Oxygen Prescription  Continuous  Continuous  Continuous     Liters per minute  4  4  4      FiO2%  99  99  99     Comments  Patient uses O2 4 to 6 L/M prn during exercise.  Patient uses O2 4 to 6 L/M prn during exercise.  --       Home Oxygen   Home Oxygen Device  Home Concentrator;E-Tanks  Home Concentrator;E-Tanks  Home Concentrator;E-Tanks     Sleep Oxygen Prescription  Continuous  Continuous  Continuous     Liters per minute  2  2  2      Home Exercise Oxygen Prescription  Continuous  Continuous  Continuous     Liters per minute  4  4  4      Home at Rest Exercise Oxygen Prescription  Continuous  Continuous  Continuous     Liters per minute  2  2  2      Compliance with Home Oxygen Use  Yes  Yes  Yes       Goals/Expected Outcomes   Short Term Goals  To learn and understand importance of maintaining oxygen saturations>88%;To learn and demonstrate proper pursed lip breathing techniques or other breathing techniques.;To learn and understand importance of monitoring SPO2 with pulse oximeter and demonstrate accurate use of the pulse oximeter.;To learn and exhibit compliance with exercise, home and travel O2 prescription  To learn and understand importance of maintaining oxygen saturations>88%;To learn and demonstrate proper pursed lip breathing techniques or other breathing techniques.;To learn and understand importance of monitoring SPO2 with pulse oximeter and demonstrate accurate use of the pulse oximeter.;To learn and exhibit compliance with exercise, home and travel O2 prescription  To learn and understand importance of maintaining oxygen saturations>88%;To learn and demonstrate proper pursed lip breathing techniques or other breathing techniques.;To learn and understand importance of monitoring SPO2 with pulse oximeter and demonstrate  accurate use of the pulse oximeter.;To learn and exhibit compliance with exercise, home and travel O2 prescription     Long  Term Goals  Exhibits compliance with exercise, home and travel O2 prescription;Maintenance of O2 saturations>88%;Exhibits proper breathing techniques, such as pursed lip breathing or other method taught during program session  Exhibits compliance with exercise, home and travel O2 prescription;Maintenance of O2 saturations>88%;Exhibits proper breathing techniques, such as pursed lip breathing or other method taught during program session  Exhibits compliance with exercise, home and travel O2 prescription;Maintenance of O2 saturations>88%;Exhibits proper breathing techniques, such as pursed lip breathing or other method taught during program session     Comments  Patient is able to verbalize the importance of maintaining his O2 >88% and is able to properly demonstrate pursed lip breathing properly. We are not allowing them to use the pulse oximeter due to COVID. Will continue to monitor for progress.  Patient is able to verbalize the importance of maintaining his O2 >88% and is able to properly demonstrate pursed lip breathing properly. We are not allowing them to use the pulse oximeter due to COVID. Will continue to monitor for progress.  Patient is able to verbalize the importance of maintaining his O2 >88% and is able to properly demonstrate pursed lip breathing properly. We are not allowing them to use the pulse oximeter due to COVID. Will continue to monitor for progress.     Goals/Expected Outcomes  Patient  will continue to meet both his short and long term goals.  Patient will continue to meet both his short and long term goals.  Patient will continue to meet both his short and long term goals.        Oxygen Discharge (Final Oxygen Re-Evaluation): Oxygen Re-Evaluation - 12/24/18 0754      Program Oxygen Prescription   Program Oxygen Prescription  Continuous    Liters per  minute  4    FiO2%  99      Home Oxygen   Home Oxygen Device  Home Concentrator;E-Tanks    Sleep Oxygen Prescription  Continuous    Liters per minute  2    Home Exercise Oxygen Prescription  Continuous    Liters per minute  4    Home at Rest Exercise Oxygen Prescription  Continuous    Liters per minute  2    Compliance with Home Oxygen Use  Yes      Goals/Expected Outcomes   Short Term Goals  To learn and understand importance of maintaining oxygen saturations>88%;To learn and demonstrate proper pursed lip breathing techniques or other breathing techniques.;To learn and understand importance of monitoring SPO2 with pulse oximeter and demonstrate accurate use of the pulse oximeter.;To learn and exhibit compliance with exercise, home and travel O2 prescription    Long  Term Goals  Exhibits compliance with exercise, home and travel O2 prescription;Maintenance of O2 saturations>88%;Exhibits proper breathing techniques, such as pursed lip breathing or other method taught during program session    Comments  Patient is able to verbalize the importance of maintaining his O2 >88% and is able to properly demonstrate pursed lip breathing properly. We are not allowing them to use the pulse oximeter due to COVID. Will continue to monitor for progress.    Goals/Expected Outcomes  Patient will continue to meet both his short and long term goals.       Initial Exercise Prescription: Initial Exercise Prescription - 10/14/18 1300      Date of Initial Exercise RX and Referring Provider   Date  10/14/18    Referring Provider  Sood    Expected Discharge Date  01/13/19      Oxygen   Oxygen  Continuous    Liters  6   with exertion     NuStep   Level  1    SPM  88    Minutes  17    METs  2.1      Arm/Foot Ergometer   Level  1    Watts  12    Minutes  17    METs  1.9      Prescription Details   Frequency (times per week)  3    Duration  Progress to 30 minutes of continuous aerobic without  signs/symptoms of physical distress      Intensity   THRR 40-80% of Max Heartrate  [phone]    Ratings of Perceived Exertion  11-13    Perceived Dyspnea  0-4      Progression   Progression  Continue progressive overload as per policy without signs/symptoms or physical distress.      Resistance Training   Training Prescription  Yes    Weight  1    Reps  10-15       Perform Capillary Blood Glucose checks as needed.  Exercise Prescription Changes:  Exercise Prescription Changes    Row Name 10/14/18 1300 11/05/18 1300 11/26/18 1800 12/23/18 0900  Response to Exercise   Blood Pressure (Admit)  100/64  138/78  124/62  178/70    Blood Pressure (Exercise)  120/68  160/74  150/80  146/60    Blood Pressure (Exit)  108/64  128/64  120/70  144/80    Heart Rate (Admit)  100 bpm  95 bpm  91 bpm  100 bpm    Heart Rate (Exercise)  125 bpm  109 bpm  104 bpm  105 bpm    Heart Rate (Exit)  110 bpm  101 bpm  106 bpm  107 bpm    Oxygen Saturation (Admit)  96 %  97 %  100 %  97 %    Oxygen Saturation (Exercise)  94 %  97 %  97 %  99 %    Oxygen Saturation (Exit)  96 %  99 %  99 %  99 %    Rating of Perceived Exertion (Exercise)  17  15  16  14     Perceived Dyspnea (Exercise)  16  14  14  14     Symptoms  SOB  SOB  SOB  SOB    Comments  6MWT  first two weeks of exercise   incresed workloads  not able to increase workload    Duration  Progress to 30 minutes of  aerobic without signs/symptoms of physical distress  Progress to 30 minutes of  aerobic without signs/symptoms of physical distress  Continue with 30 min of aerobic exercise without signs/symptoms of physical distress.  Continue with 30 min of aerobic exercise without signs/symptoms of physical distress.    Intensity  THRR New [phone]  THRR unchanged  THRR unchanged  THRR unchanged      Progression   Progression  --  Continue to progress workloads to maintain intensity without signs/symptoms of physical distress.  Continue to  progress workloads to maintain intensity without signs/symptoms of physical distress.  Continue to progress workloads to maintain intensity without signs/symptoms of physical distress.    Average METs  --  2.05  2.1  2.1      Resistance Training   Training Prescription  --  Yes  Yes  Yes    Weight  --  1  2  2     Reps  --  10-15  10-15  10-15    Time  --  --  10 Minutes  10 Minutes      Oxygen   Oxygen  --  Continuous  --  --    Liters  --  6  6  4-6      NuStep   Level  --  1  2  1     SPM  --  74  79  73    Minutes  --  22  22  22     METs  --  2.4  2.4  2.1      Arm Ergometer   Level  --  --  1.3  1.4    Watts  --  --  25  25    Minutes  --  --  17  17    METs  --  --  1.8  1.9      Arm/Foot Ergometer   Level  --  1  --  --    Watts  --  12  --  --    Minutes  --  22  --  --    METs  --  1.7  --  --  Home Exercise Plan   Plans to continue exercise at  --  --  Home (comment)  Home (comment)    Frequency  --  --  Add 3 additional days to program exercise sessions.  Add 3 additional days to program exercise sessions.    Initial Home Exercises Provided  --  --  10/14/18  10/14/18       Exercise Comments:  Exercise Comments    Row Name 10/28/18 1454 11/05/18 1314 11/26/18 1819 12/23/18 1013     Exercise Comments  Bruno has attended 4 sessions so far. He is working on the Scientist, product/process development. The Nustep is the more difficult piece of equipment for him and he has to stop several times during the 17 minutes due to shortness of breath. He has already seen some improvement though and reports that he is already starting to feel better and more energized.  Pt. is doing well in the program. He does struggle with his shortness of breath on both pieces of equipment but he works as hard as he's able.  Benson is doing well with what he is able to do. He works hard inspite of his SOB. He attends class consistently. We will continue to progress him as he can tolerate until he  graduates.  Yuuki has been coming faithfully. He really has a hard time exercising, however he does it anyway and will stop to catch his breath. He has recently been sick so he has not been attending as regularily. The nustep is the hardest equipment for him. Since he gets so SOB we are not able to progress him on the Nustep. Will continute to progress him on Arm Crank as tolerated.       Exercise Goals and Review:  Exercise Goals    Row Name 10/14/18 1350             Exercise Goals   Increase Physical Activity  Yes       Intervention  Provide advice, education, support and counseling about physical activity/exercise needs.;Develop an individualized exercise prescription for aerobic and resistive training based on initial evaluation findings, risk stratification, comorbidities and participant's personal goals.       Expected Outcomes  Short Term: Attend rehab on a regular basis to increase amount of physical activity.;Long Term: Add in home exercise to make exercise part of routine and to increase amount of physical activity.;Long Term: Exercising regularly at least 3-5 days a week.       Increase Strength and Stamina  Yes       Intervention  Provide advice, education, support and counseling about physical activity/exercise needs.;Develop an individualized exercise prescription for aerobic and resistive training based on initial evaluation findings, risk stratification, comorbidities and participant's personal goals.       Expected Outcomes  Short Term: Increase workloads from initial exercise prescription for resistance, speed, and METs.;Short Term: Perform resistance training exercises routinely during rehab and add in resistance training at home;Long Term: Improve cardiorespiratory fitness, muscular endurance and strength as measured by increased METs and functional capacity (6MWT)       Able to understand and use rate of perceived exertion (RPE) scale  Yes       Intervention  Provide  education and explanation on how to use RPE scale       Expected Outcomes  Short Term: Able to use RPE daily in rehab to express subjective intensity level;Long Term:  Able to use RPE to guide intensity level when exercising  independently       Able to understand and use Dyspnea scale  Yes       Intervention  Provide education and explanation on how to use Dyspnea scale       Expected Outcomes  Short Term: Able to use Dyspnea scale daily in rehab to express subjective sense of shortness of breath during exertion;Long Term: Able to use Dyspnea scale to guide intensity level when exercising independently       Knowledge and understanding of Target Heart Rate Range (THRR)  Yes       Intervention  Provide education and explanation of THRR including how the numbers were predicted and where they are located for reference       Expected Outcomes  Short Term: Able to state/look up THRR;Long Term: Able to use THRR to govern intensity when exercising independently;Short Term: Able to use daily as guideline for intensity in rehab       Able to check pulse independently  Yes       Intervention  Provide education and demonstration on how to check pulse in carotid and radial arteries.;Review the importance of being able to check your own pulse for safety during independent exercise       Expected Outcomes  Short Term: Able to explain why pulse checking is important during independent exercise;Long Term: Able to check pulse independently and accurately       Understanding of Exercise Prescription  Yes       Intervention  Provide education, explanation, and written materials on patient's individual exercise prescription       Expected Outcomes  Short Term: Able to explain program exercise prescription;Long Term: Able to explain home exercise prescription to exercise independently          Exercise Goals Re-Evaluation : Exercise Goals Re-Evaluation    Row Name 10/28/18 1452 11/26/18 1815 12/23/18 1007          Exercise Goal Re-Evaluation   Exercise Goals Review  Increase Physical Activity;Increase Strength and Stamina;Able to understand and use rate of perceived exertion (RPE) scale;Able to understand and use Dyspnea scale;Knowledge and understanding of Target Heart Rate Range (THRR);Able to check pulse independently;Understanding of Exercise Prescription  Increase Physical Activity;Increase Strength and Stamina;Able to understand and use Dyspnea scale  Increase Physical Activity;Increase Strength and Stamina     Comments  Pt. is new to the program. Although he is rather debilitated because of his shortness of breath he comes to rehab ready and willing to work the best he can.  Drevion has attended 9 sessions. He is doing well in the program. He is very consistent with his attendance. He does struggle with his SOB and stops several times to catch his breath, however, he does work hard and completes each session.  Patient is wanting to do mor things again, to be able to take the garbage out, take a shower and go to the mailbox without getting extremely SOB     Expected Outcomes  Short: increase overall activity level Long: walk to his trashcan without having to stop.  He wants to be able to take out the garbage, walk to the mailbox, and take a shower w/o SOB.  To be able to get back to his normal ADL's w/o SOB.        Discharge Exercise Prescription (Final Exercise Prescription Changes): Exercise Prescription Changes - 12/23/18 0900      Response to Exercise   Blood Pressure (Admit)  178/70    Blood  Pressure (Exercise)  146/60    Blood Pressure (Exit)  144/80    Heart Rate (Admit)  100 bpm    Heart Rate (Exercise)  105 bpm    Heart Rate (Exit)  107 bpm    Oxygen Saturation (Admit)  97 %    Oxygen Saturation (Exercise)  99 %    Oxygen Saturation (Exit)  99 %    Rating of Perceived Exertion (Exercise)  14    Perceived Dyspnea (Exercise)  14    Symptoms  SOB    Comments  not able to increase workload     Duration  Continue with 30 min of aerobic exercise without signs/symptoms of physical distress.    Intensity  THRR unchanged      Progression   Progression  Continue to progress workloads to maintain intensity without signs/symptoms of physical distress.    Average METs  2.1      Resistance Training   Training Prescription  Yes    Weight  2    Reps  10-15    Time  10 Minutes      Oxygen   Liters  4-6      NuStep   Level  1    SPM  73    Minutes  22    METs  2.1      Arm Ergometer   Level  1.4    Watts  25    Minutes  17    METs  1.9      Home Exercise Plan   Plans to continue exercise at  Home (comment)    Frequency  Add 3 additional days to program exercise sessions.    Initial Home Exercises Provided  10/14/18       Nutrition:  Target Goals: Understanding of nutrition guidelines, daily intake of sodium 1500mg , cholesterol 200mg , calories 30% from fat and 7% or less from saturated fats, daily to have 5 or more servings of fruits and vegetables.  Biometrics: Pre Biometrics - 10/14/18 1355      Pre Biometrics   Height  5\' 10"  (1.778 m)    Waist Circumference  41.5 inches    Hip Circumference  40 inches    Waist to Hip Ratio  1.04 %    Triceps Skinfold  7 mm    % Body Fat  24.7 %    Grip Strength  2.2 kg    Flexibility  --   hip problems   Single Leg Stand  --   unable to perform       Nutrition Therapy Plan and Nutrition Goals: Nutrition Therapy & Goals - 12/24/18 0755      Personal Nutrition Goals   Comments  We have resumed our RD classes and are currently working on scheduling patients. Patient scored 36 on his initial medficts diet assessment.  He continues to say he has not changed his diet since he started the program. Will continue to monitor for progress.      Intervention Plan   Intervention  Nutrition handout(s) given to patient.    Expected Outcomes  Short Term Goal: Understand basic principles of dietary content, such as calories, fat,  sodium, cholesterol and nutrients.       Nutrition Assessments: Nutrition Assessments - 10/14/18 1450      MEDFICTS Scores   Pre Score  36       Nutrition Goals Re-Evaluation:   Nutrition Goals Discharge (Final Nutrition Goals Re-Evaluation):   Psychosocial: Target Goals: Acknowledge presence  or absence of significant depression and/or stress, maximize coping skills, provide positive support system. Participant is able to verbalize types and ability to use techniques and skills needed for reducing stress and depression.  Initial Review & Psychosocial Screening: Initial Psych Review & Screening - 10/14/18 1448      Initial Review   Current issues with  Current Depression      Family Dynamics   Good Support System?  Yes      Barriers   Psychosocial barriers to participate in program  Psychosocial barriers identified (see note)   Mainly because of health and being SOB     Screening Interventions   Expected Outcomes  Short Term goal: Identification and review with participant of any Quality of Life or Depression concerns found by scoring the questionnaire.;Long Term goal: The participant improves quality of Life and PHQ9 Scores as seen by post scores and/or verbalization of changes       Quality of Life Scores: Quality of Life - 10/14/18 1413      Quality of Life   Select  Quality of Life      Quality of Life Scores   Health/Function Pre  11.63 %    Socioeconomic Pre  22.8 %    Psych/Spiritual Pre  23.14 %    Family Pre  28.8 %    GLOBAL Pre  18.36 %      Scores of 19 and below usually indicate a poorer quality of life in these areas.  A difference of  2-3 points is a clinically meaningful difference.  A difference of 2-3 points in the total score of the Quality of Life Index has been associated with significant improvement in overall quality of life, self-image, physical symptoms, and general health in studies assessing change in quality of life.   PHQ-9: Recent  Review Flowsheet Data    Depression screen [facility] 2/9 10/14/2018 04/08/2017   Decreased Interest 0 0   Down, Depressed, Hopeless 0 0   PHQ - 2 Score 0 0   Altered sleeping 0 -   Tired, decreased energy 1 -   Change in appetite 0 -   Feeling bad or failure about yourself  3 -   Trouble concentrating 0 -   Moving slowly or fidgety/restless 0 -   Suicidal thoughts 0 -   PHQ-9 Score 4 -   Difficult doing work/chores Somewhat difficult -     Interpretation of Total Score  Total Score Depression Severity:  1-4 = Minimal depression, 5-9 = Mild depression, 10-14 = Moderate depression, 15-19 = Moderately severe depression, 20-27 = Severe depression   Psychosocial Evaluation and Intervention: Psychosocial Evaluation - 10/14/18 1449      Psychosocial Evaluation & Interventions   Interventions  Encouraged to exercise with the program and follow exercise prescription    Expected Outcomes  To feel better and not be so SOB    Continue Psychosocial Services   Follow up required by staff       Psychosocial Re-Evaluation: Psychosocial Re-Evaluation    Row Name 10/29/18 0928 11/26/18 1340 12/24/18 0800         Psychosocial Re-Evaluation   Current issues with  None Identified  None Identified  None Identified     Comments  Patient's initial QOL score was 18.36% and his PHQ-9 score was 4 with no psychosocial issues identified. Will continue to monitor for progress.  Patient's initial QOL score was 18.36% and his PHQ-9 score was 4 with no psychosocial issues identified.  Will continue to monitor for progress.  Patient's initial QOL score was 18.36% and his PHQ-9 score was 4 with no psychosocial issues identified. Will continue to monitor for progress.     Expected Outcomes  Patient will have no psychosocial issues identified at discharge.  Patient will have no psychosocial issues identified at discharge.  Patient will have no psychosocial issues identified at discharge.     Interventions  Stress  management education;Encouraged to attend Pulmonary Rehabilitation for the exercise;Relaxation education  Stress management education;Encouraged to attend Pulmonary Rehabilitation for the exercise;Relaxation education  Stress management education;Encouraged to attend Pulmonary Rehabilitation for the exercise;Relaxation education     Continue Psychosocial Services   No Follow up required  No Follow up required  No Follow up required        Psychosocial Discharge (Final Psychosocial Re-Evaluation): Psychosocial Re-Evaluation - 12/24/18 0800      Psychosocial Re-Evaluation   Current issues with  None Identified    Comments  Patient's initial QOL score was 18.36% and his PHQ-9 score was 4 with no psychosocial issues identified. Will continue to monitor for progress.    Expected Outcomes  Patient will have no psychosocial issues identified at discharge.    Interventions  Stress management education;Encouraged to attend Pulmonary Rehabilitation for the exercise;Relaxation education    Continue Psychosocial Services   No Follow up required        Education: Education Goals: Education classes will be provided on a weekly basis, covering required topics. Participant will state understanding/return demonstration of topics presented.  Learning Barriers/Preferences: Learning Barriers/Preferences - 10/14/18 1450      Learning Barriers/Preferences   Learning Barriers  None    Learning Preferences  Pictoral;Written Material       Education Topics: How Lungs Work and Diseases: - Discuss the anatomy of the lungs and diseases that can affect the lungs, such as COPD.   PULMONARY REHAB CHRONIC OBSTRUCTIVE PULMONARY DISEASE from 12/17/2018 in [place]  Date  10/22/18  Educator  DJ  Instruction Review Code  2- Demonstrated Understanding      Exercise: -Discuss the importance of exercise, FITT principles of exercise, normal and abnormal responses to exercise, and how to  exercise safely.   Environmental Irritants: -Discuss types of environmental irritants and how to limit exposure to environmental irritants.   Meds/Inhalers and oxygen: - Discuss respiratory medications, definition of an inhaler and oxygen, and the proper way to use an inhaler and oxygen.   Energy Saving Techniques: - Discuss methods to conserve energy and decrease shortness of breath when performing activities of daily living.    Bronchial Hygiene / Breathing Techniques: - Discuss breathing mechanics, pursed-lip breathing technique,  proper posture, effective ways to clear airways, and other functional breathing techniques   PULMONARY REHAB CHRONIC OBSTRUCTIVE PULMONARY DISEASE from 12/17/2018 in [place]  Date  11/19/18  Educator  DC  Instruction Review Code  2- Demonstrated Understanding      Cleaning Equipment: - Provides group verbal and written instruction about the health risks of elevated stress, cause of high stress, and healthy ways to reduce stress.   Nutrition I: Fats: - Discuss the types of cholesterol, what cholesterol does to the body, and how cholesterol levels can be controlled.   Nutrition II: Labels: -Discuss the different components of food labels and how to read food labels.   Respiratory Infections: - Discuss the signs and symptoms of respiratory infections, ways to prevent respiratory infections, and the importance of seeking  medical treatment when having a respiratory infection.   PULMONARY REHAB CHRONIC OBSTRUCTIVE PULMONARY DISEASE from 12/17/2018 in [place]  Date  12/17/18  Educator  DWynetta Emery  Instruction Review Code  2- Demonstrated Understanding      Stress I: Signs and Symptoms: - Discuss the causes of stress, how stress may lead to anxiety and depression, and ways to limit stress.   Stress II: Relaxation: -Discuss relaxation techniques to limit stress.   Oxygen for Home/Travel: -  Discuss how to prepare for travel when on oxygen and proper ways to transport and store oxygen to ensure safety.   Knowledge Questionnaire Score: Knowledge Questionnaire Score - 10/14/18 1451      Knowledge Questionnaire Score   Pre Score  15/18       Core Components/Risk Factors/Patient Goals at Admission: Personal Goals and Risk Factors at Admission - 10/14/18 1451      Core Components/Risk Factors/Patient Goals on Admission    Weight Management  Weight Maintenance    Personal Goal Other  Yes    Personal Goal  Be able to do thing again. Get back to normal ADL's    Intervention  Attend PR 2 x week and supplement with 3 x week at home exercise.    Expected Outcomes  To be able to breather better while doing daily task       Core Components/Risk Factors/Patient Goals Review:  Goals and Risk Factor Review    Row Name 10/29/18 4401 11/26/18 1337 12/24/18 0756         Core Components/Risk Factors/Patient Goals Review   Personal Goals Review  -- Be able to do things again; get back to ADL's; take out trash.  Improve shortness of breath with ADL's;Other Be able to do things; get back to ADL's; Take our trash.  Improve shortness of breath with ADL's;Other Be able to do things again; get back to ADL's; take trash out.     Review  Patient has completed 4 sessions losing 1 lb since his initial visit. He is doing well in the program with progression. He says he is able to get around the house better and is walking better. He also states taking showers are getting better. He used to have to stop 3 to 4 times and now he only has to stop once to "catch his breath". He is very pleased with his progress with only 4 sessions. Will continue to monitor for progress.  Patient has completed 9 sessions gaining 1 lb since last 30 day review. He continues to well in the program with progression. He continues to say he is able to do more without having to stop as much to catch his breathe. He continues to walk  better and is feeling stronger. He feels like he is making progress toward meeting his personal goals. Will continue to monitor for progress.  Patient has completed 12 sessions losing 1 lb since last 30 day review. He continues to do well in the program with some progression. He has missed several sessions during this review period due to having a UTI and the subsequent treatment causng general malaise. Hopefully, he will be able to return to the program and continue to work toward meeting his personal goals.     Expected Outcomes  Patient will continue to attend sessions and complete the program meeting his personal goals.  Patient will continue to attend sessions and complete the program meeting his personal goals.  Patient will continue to attend sessions  and complete the program meeting his personal goals.        Core Components/Risk Factors/Patient Goals at Discharge (Final Review):  Goals and Risk Factor Review - 12/24/18 0756      Core Components/Risk Factors/Patient Goals Review   Personal Goals Review  Improve shortness of breath with ADL's;Other   Be able to do things again; get back to ADL's; take trash out.   Review  Patient has completed 12 sessions losing 1 lb since last 30 day review. He continues to do well in the program with some progression. He has missed several sessions during this review period due to having a UTI and the subsequent treatment causng general malaise. Hopefully, he will be able to return to the program and continue to work toward meeting his personal goals.    Expected Outcomes  Patient will continue to attend sessions and complete the program meeting his personal goals.       ITP Comments: ITP Comments    Row Name 10/14/18 1433 01/22/19 1348 02/18/19 1256       ITP Comments  Patient has COPD and due to extreme SOB will not be able to walk the Treadmill. Will do all seated exercises.  Pulmonary rehab has been suspended due to department closure and staff  reassignment secondary to [place]. Patient wants to return and complete the program when we reopen. He is not interested in participating in the [facility] virtual program. Will f/u when we reopen.  Patient stopped attending the program 12/17/18 due to health issues. He declined to return when we reopened 02/15/19. He completed 12 sessions. MD will be notified.        Comments: Patient stopped coming to Pulmonary Rehab on    12/17/18 due to health issues. He declined to return to the program when we reopened 02/15/19. He completed 12 sessions.  Doctor will be informed.

## 2019-02-18 NOTE — Addendum Note (Signed)
Encounter addended by: Dwana Melena, RN on: 02/18/2019 1:03 PM  Actions taken: Flowsheet accepted, Clinical Note Signed, Episode resolved

## 2019-02-18 NOTE — Progress Notes (Addendum)
Discharge Progress Report  Patient Details  Name: Mark Cline MRN: [id number] Date of Birth: 11-09-1945 Referring Provider:     PULMONARY REHAB COPD ORIENTATION from 10/14/2018 in [place]  Referring Provider  Sood       Number of Visits: 12  Reason for Discharge:  Early Exit:  Personal  Smoking History:  Social History   Tobacco Use  Smoking Status Former Smoker  . Packs/day: 0.50  . Years: 60.00  . Pack years: 30.00  . Types: Cigarettes  . Quit date: 07/28/2016  . Years since quitting: 2.5  Smokeless Tobacco Never Used    Diagnosis:  COPD with chronic bronchitis and emphysema ([place])  ADL UCSD: Pulmonary Assessment Scores    Row Name 10/14/18 1445         ADL UCSD   ADL Phase  Entry     SOB Score total  100     Rest  0     Walk  13     Stairs  5     Bath  5     Dress  5     Shop  5       CAT Score   CAT Score  20       mMRC Score   mMRC Score  3        Initial Exercise Prescription: Initial Exercise Prescription - 10/14/18 1300      Date of Initial Exercise RX and Referring Provider   Date  10/14/18    Referring Provider  Sood    Expected Discharge Date  01/13/19      Oxygen   Oxygen  Continuous    Liters  6   with exertion     NuStep   Level  1    SPM  88    Minutes  17    METs  2.1      Arm/Foot Ergometer   Level  1    Watts  12    Minutes  17    METs  1.9      Prescription Details   Frequency (times per week)  3    Duration  Progress to 30 minutes of continuous aerobic without signs/symptoms of physical distress      Intensity   THRR 40-80% of Max Heartrate  [phone]    Ratings of Perceived Exertion  11-13    Perceived Dyspnea  0-4      Progression   Progression  Continue progressive overload as per policy without signs/symptoms or physical distress.      Resistance Training   Training Prescription  Yes    Weight  1    Reps  10-15       Discharge Exercise Prescription (Final Exercise  Prescription Changes): Exercise Prescription Changes - 12/23/18 0900      Response to Exercise   Blood Pressure (Admit)  178/70    Blood Pressure (Exercise)  146/60    Blood Pressure (Exit)  144/80    Heart Rate (Admit)  100 bpm    Heart Rate (Exercise)  105 bpm    Heart Rate (Exit)  107 bpm    Oxygen Saturation (Admit)  97 %    Oxygen Saturation (Exercise)  99 %    Oxygen Saturation (Exit)  99 %    Rating of Perceived Exertion (Exercise)  14    Perceived Dyspnea (Exercise)  14    Symptoms  SOB    Comments  not able to  increase workload    Duration  Continue with 30 min of aerobic exercise without signs/symptoms of physical distress.    Intensity  THRR unchanged      Progression   Progression  Continue to progress workloads to maintain intensity without signs/symptoms of physical distress.    Average METs  2.1      Resistance Training   Training Prescription  Yes    Weight  2    Reps  10-15    Time  10 Minutes      Oxygen   Liters  4-6      NuStep   Level  1    SPM  73    Minutes  22    METs  2.1      Arm Ergometer   Level  1.4    Watts  25    Minutes  17    METs  1.9      Home Exercise Plan   Plans to continue exercise at  Home (comment)    Frequency  Add 3 additional days to program exercise sessions.    Initial Home Exercises Provided  10/14/18       Functional Capacity: 6 Minute Walk    Row Name 10/14/18 1347         6 Minute Walk   Phase  Initial     Distance  400 feet     Walk Time  2.5 minutes     # of Rest Breaks  2     MPH  0.75     METS  1.58     RPE  17     Perceived Dyspnea   16     VO2 Peak  4.83     Symptoms  Yes (comment)     Comments  extreme SOB     Resting HR  100 bpm     Resting BP  100/64     Resting Oxygen Saturation   96 %     Exercise Oxygen Saturation  during 6 min walk  94 %     Max Ex. HR  125 bpm     Max Ex. BP  120/68     2 Minute Post BP  108/64        Psychological, QOL, Others - Outcomes: PHQ  2/9: Depression screen [facility] 2/9 10/14/2018 04/08/2017  Decreased Interest 0 0  Down, Depressed, Hopeless 0 0  PHQ - 2 Score 0 0  Altered sleeping 0 -  Tired, decreased energy 1 -  Change in appetite 0 -  Feeling bad or failure about yourself  3 -  Trouble concentrating 0 -  Moving slowly or fidgety/restless 0 -  Suicidal thoughts 0 -  PHQ-9 Score 4 -  Difficult doing work/chores Somewhat difficult -    Quality of Life: Quality of Life - 10/14/18 1413      Quality of Life   Select  Quality of Life      Quality of Life Scores   Health/Function Pre  11.63 %    Socioeconomic Pre  22.8 %    Psych/Spiritual Pre  23.14 %    Family Pre  28.8 %    GLOBAL Pre  18.36 %       Personal Goals: Goals established at orientation with interventions provided to work toward goal. Personal Goals and Risk Factors at Admission - 10/14/18 1451      Core Components/Risk Factors/Patient Goals on Admission    Weight Management  Weight Maintenance    Personal Goal Other  Yes    Personal Goal  Be able to do thing again. Get back to normal ADL's    Intervention  Attend PR 2 x week and supplement with 3 x week at home exercise.    Expected Outcomes  To be able to breather better while doing daily task        Personal Goals Discharge: Goals and Risk Factor Review    Row Name 10/29/18 3267 11/26/18 1337 12/24/18 0756         Core Components/Risk Factors/Patient Goals Review   Personal Goals Review  -- Be able to do things again; get back to ADL's; take out trash.  Improve shortness of breath with ADL's;Other Be able to do things; get back to ADL's; Take our trash.  Improve shortness of breath with ADL's;Other Be able to do things again; get back to ADL's; take trash out.     Review  Patient has completed 4 sessions losing 1 lb since his initial visit. He is doing well in the program with progression. He says he is able to get around the house better and is walking better. He also states taking  showers are getting better. He used to have to stop 3 to 4 times and now he only has to stop once to "catch his breath". He is very pleased with his progress with only 4 sessions. Will continue to monitor for progress.  Patient has completed 9 sessions gaining 1 lb since last 30 day review. He continues to well in the program with progression. He continues to say he is able to do more without having to stop as much to catch his breathe. He continues to walk better and is feeling stronger. He feels like he is making progress toward meeting his personal goals. Will continue to monitor for progress.  Patient has completed 12 sessions losing 1 lb since last 30 day review. He continues to do well in the program with some progression. He has missed several sessions during this review period due to having a UTI and the subsequent treatment causng general malaise. Hopefully, he will be able to return to the program and continue to work toward meeting his personal goals.     Expected Outcomes  Patient will continue to attend sessions and complete the program meeting his personal goals.  Patient will continue to attend sessions and complete the program meeting his personal goals.  Patient will continue to attend sessions and complete the program meeting his personal goals.        Exercise Goals and Review: Exercise Goals    Row Name 10/14/18 1350             Exercise Goals   Increase Physical Activity  Yes       Intervention  Provide advice, education, support and counseling about physical activity/exercise needs.;Develop an individualized exercise prescription for aerobic and resistive training based on initial evaluation findings, risk stratification, comorbidities and participant's personal goals.       Expected Outcomes  Short Term: Attend rehab on a regular basis to increase amount of physical activity.;Long Term: Add in home exercise to make exercise part of routine and to increase amount of physical  activity.;Long Term: Exercising regularly at least 3-5 days a week.       Increase Strength and Stamina  Yes       Intervention  Provide advice, education, support and counseling about physical activity/exercise needs.;Develop an individualized exercise  prescription for aerobic and resistive training based on initial evaluation findings, risk stratification, comorbidities and participant's personal goals.       Expected Outcomes  Short Term: Increase workloads from initial exercise prescription for resistance, speed, and METs.;Short Term: Perform resistance training exercises routinely during rehab and add in resistance training at home;Long Term: Improve cardiorespiratory fitness, muscular endurance and strength as measured by increased METs and functional capacity (6MWT)       Able to understand and use rate of perceived exertion (RPE) scale  Yes       Intervention  Provide education and explanation on how to use RPE scale       Expected Outcomes  Short Term: Able to use RPE daily in rehab to express subjective intensity level;Long Term:  Able to use RPE to guide intensity level when exercising independently       Able to understand and use Dyspnea scale  Yes       Intervention  Provide education and explanation on how to use Dyspnea scale       Expected Outcomes  Short Term: Able to use Dyspnea scale daily in rehab to express subjective sense of shortness of breath during exertion;Long Term: Able to use Dyspnea scale to guide intensity level when exercising independently       Knowledge and understanding of Target Heart Rate Range (THRR)  Yes       Intervention  Provide education and explanation of THRR including how the numbers were predicted and where they are located for reference       Expected Outcomes  Short Term: Able to state/look up THRR;Long Term: Able to use THRR to govern intensity when exercising independently;Short Term: Able to use daily as guideline for intensity in rehab       Able  to check pulse independently  Yes       Intervention  Provide education and demonstration on how to check pulse in carotid and radial arteries.;Review the importance of being able to check your own pulse for safety during independent exercise       Expected Outcomes  Short Term: Able to explain why pulse checking is important during independent exercise;Long Term: Able to check pulse independently and accurately       Understanding of Exercise Prescription  Yes       Intervention  Provide education, explanation, and written materials on patient's individual exercise prescription       Expected Outcomes  Short Term: Able to explain program exercise prescription;Long Term: Able to explain home exercise prescription to exercise independently          Exercise Goals Re-Evaluation: Exercise Goals Re-Evaluation    Row Name 10/28/18 1452 11/26/18 1815 12/23/18 1007         Exercise Goal Re-Evaluation   Exercise Goals Review  Increase Physical Activity;Increase Strength and Stamina;Able to understand and use rate of perceived exertion (RPE) scale;Able to understand and use Dyspnea scale;Knowledge and understanding of Target Heart Rate Range (THRR);Able to check pulse independently;Understanding of Exercise Prescription  Increase Physical Activity;Increase Strength and Stamina;Able to understand and use Dyspnea scale  Increase Physical Activity;Increase Strength and Stamina     Comments  Pt. is new to the program. Although he is rather debilitated because of his shortness of breath he comes to rehab ready and willing to work the best he can.  Haruto has attended 9 sessions. He is doing well in the program. He is very consistent with his attendance. He does struggle  with his SOB and stops several times to catch his breath, however, he does work hard and completes each session.  Patient is wanting to do mor things again, to be able to take the garbage out, take a shower and go to the mailbox without getting  extremely SOB     Expected Outcomes  Short: increase overall activity level Long: walk to his trashcan without having to stop.  He wants to be able to take out the garbage, walk to the mailbox, and take a shower w/o SOB.  To be able to get back to his normal ADL's w/o SOB.        Nutrition & Weight - Outcomes: Pre Biometrics - 10/14/18 1355      Pre Biometrics   Height  5\' 10"  (1.778 m)    Waist Circumference  41.5 inches    Hip Circumference  40 inches    Waist to Hip Ratio  1.04 %    Triceps Skinfold  7 mm    % Body Fat  24.7 %    Grip Strength  2.2 kg    Flexibility  --   hip problems   Single Leg Stand  --   unable to perform       Nutrition: Nutrition Therapy & Goals - 12/24/18 0755      Personal Nutrition Goals   Comments  We have resumed our RD classes and are currently working on scheduling patients. Patient scored 36 on his initial medficts diet assessment.  He continues to say he has not changed his diet since he started the program. Will continue to monitor for progress.      Intervention Plan   Intervention  Nutrition handout(s) given to patient.    Expected Outcomes  Short Term Goal: Understand basic principles of dietary content, such as calories, fat, sodium, cholesterol and nutrients.       Nutrition Discharge: Nutrition Assessments - 10/14/18 1450      MEDFICTS Scores   Pre Score  36       Education Questionnaire Score: Knowledge Questionnaire Score - 10/14/18 1451      Knowledge Questionnaire Score   Pre Score  15/18       Patient stopped attending 12/17/18 due to health issues. He declined to return after we reopened 02/15/19. MD will be notified.

## 2019-03-05 DIAGNOSIS — E782 Mixed hyperlipidemia: Secondary | ICD-10-CM | POA: Diagnosis not present

## 2019-03-05 DIAGNOSIS — Z299 Encounter for prophylactic measures, unspecified: Secondary | ICD-10-CM | POA: Diagnosis not present

## 2019-03-05 DIAGNOSIS — I10 Essential (primary) hypertension: Secondary | ICD-10-CM | POA: Diagnosis not present

## 2019-03-05 DIAGNOSIS — Z7189 Other specified counseling: Secondary | ICD-10-CM | POA: Diagnosis not present

## 2019-03-05 DIAGNOSIS — Z79899 Other long term (current) drug therapy: Secondary | ICD-10-CM | POA: Diagnosis not present

## 2019-03-05 DIAGNOSIS — Z0000 Encounter for general adult medical examination without abnormal findings: Secondary | ICD-10-CM | POA: Diagnosis not present

## 2019-03-05 DIAGNOSIS — R5383 Other fatigue: Secondary | ICD-10-CM | POA: Diagnosis not present

## 2019-03-08 DIAGNOSIS — J449 Chronic obstructive pulmonary disease, unspecified: Secondary | ICD-10-CM | POA: Diagnosis not present

## 2019-04-02 DIAGNOSIS — I10 Essential (primary) hypertension: Secondary | ICD-10-CM | POA: Diagnosis not present

## 2019-04-02 DIAGNOSIS — I714 Abdominal aortic aneurysm, without rupture: Secondary | ICD-10-CM | POA: Diagnosis not present

## 2019-04-02 DIAGNOSIS — Z299 Encounter for prophylactic measures, unspecified: Secondary | ICD-10-CM | POA: Diagnosis not present

## 2019-04-02 DIAGNOSIS — J449 Chronic obstructive pulmonary disease, unspecified: Secondary | ICD-10-CM | POA: Diagnosis not present

## 2019-04-05 DIAGNOSIS — J449 Chronic obstructive pulmonary disease, unspecified: Secondary | ICD-10-CM | POA: Diagnosis not present

## 2019-04-07 DIAGNOSIS — I10 Essential (primary) hypertension: Secondary | ICD-10-CM | POA: Diagnosis not present

## 2019-04-07 DIAGNOSIS — E7800 Pure hypercholesterolemia, unspecified: Secondary | ICD-10-CM | POA: Diagnosis not present

## 2019-04-07 DIAGNOSIS — J449 Chronic obstructive pulmonary disease, unspecified: Secondary | ICD-10-CM | POA: Diagnosis not present

## 2019-04-14 DIAGNOSIS — N3041 Irradiation cystitis with hematuria: Secondary | ICD-10-CM | POA: Diagnosis not present

## 2019-04-15 ENCOUNTER — Telehealth: Payer: Self-pay | Admitting: Pulmonary Disease

## 2019-04-15 ENCOUNTER — Other Ambulatory Visit: Payer: Self-pay | Admitting: Urology

## 2019-04-15 NOTE — Telephone Encounter (Signed)
Spoke with Pam  She is going to be sending over surgery clearance form  Will need to be placed in Dr Juanetta Gosling lookat  Once received will place there and send msg to VS marked urgent as he is out of the office until 05/03/19 and surgery 05/06/19

## 2019-04-15 NOTE — Telephone Encounter (Signed)
Received fax and placed in Dr Juanetta Gosling blue folder labeled "lookat" in B pod  Please sign and return to triage asap thanks!!

## 2019-04-19 NOTE — Telephone Encounter (Signed)
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   He needs to be cleared for surgery from a pulmonary standpoint if possible.  Thanks,  Jenny Reichmann

## 2019-04-19 NOTE — Telephone Encounter (Signed)
Spoke with the pt and scheduled appt with Mark Cline for 04/29/19.

## 2019-04-19 NOTE — Telephone Encounter (Signed)
Please schedule ROV with me or NP to arrange for pre-operative pulmonary assessment.

## 2019-04-26 ENCOUNTER — Telehealth: Payer: Self-pay | Admitting: Pulmonary Disease

## 2019-04-26 NOTE — Telephone Encounter (Signed)
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 Jeffie Pollock.  Please schedule an ROV with me or NP to assess his pre-operative pulmonary status.

## 2019-04-27 ENCOUNTER — Other Ambulatory Visit: Payer: Self-pay | Admitting: *Deleted

## 2019-04-27 DIAGNOSIS — I714 Abdominal aortic aneurysm, without rupture, unspecified: Secondary | ICD-10-CM

## 2019-04-28 NOTE — Patient Instructions (Addendum)
DUE TO COVID-19 ONLY ONE VISITOR IS ALLOWED TO COME WITH YOU AND STAY IN THE WAITING ROOM ONLY DURING PRE OP AND PROCEDURE DAY OF SURGERY. THE 1 VISITOR MAY VISIT WITH YOU AFTER SURGERY IN YOUR PRIVATE ROOM DURING VISITING HOURS ONLY!  YOU NEED TO HAVE A COVID 19 TEST ON: 05/03/19 @  2:40 pm , THIS TEST MUST BE DONE BEFORE SURGERY, COME  [address], [place] , [address].  ([place]) ONCE YOUR COVID TEST IS COMPLETED, PLEASE BEGIN THE QUARANTINE INSTRUCTIONS AS OUTLINED IN YOUR HANDOUT.                MJ WILLIS   Your procedure is scheduled on: 05/06/19   Report to [facility] Main  Entrance   Report to admitting at: 9:00 AM     Call this number if you have problems the morning of surgery [phone]    Remember:   [place], NO [place].     Take these medicines the morning of surgery with A SIP OF WATER: N/A. Use inhalers as usual.                                 You may not have any metal on your body including hair pins and              piercings  Do not wear jewelry, lotions, powders or perfumes, deodorant             Men may shave face and neck.   Do not bring valuables to the hospital. [place].  Contacts, dentures or bridgework may not be worn into surgery.  Leave suitcase in the car. After surgery it may be brought to your room.     Patients discharged the day of surgery will not be allowed to drive home. IF YOU ARE HAVING SURGERY AND GOING HOME THE SAME DAY, YOU MUST HAVE AN ADULT TO DRIVE YOU HOME AND BE WITH YOU FOR 24 HOURS. YOU MAY GO HOME BY TAXI OR UBER OR ORTHERWISE, BUT AN ADULT MUST ACCOMPANY YOU HOME AND STAY WITH YOU FOR 24 HOURS.  Name and phone number of your driver:  Special Instructions: N/A              Please read over the following fact sheets you were  given: _____________________________________________________________________ NO SOLID FOOD AFTER MIDNIGHT THE NIGHT PRIOR TO SURGERY. NOTHING BY MOUTH EXCEPT CLEAR LIQUIDS UNTIL: 8:00 am .  CLEAR LIQUID DIET   Foods Allowed                                                                     Foods Excluded  Coffee and tea, regular and decaf                             liquids that you cannot  Plain Jell-O any favor except red or purple  see through such as: Fruit ices (not with fruit pulp)                                     milk, soups, orange juice  Iced Popsicles                                    All solid food Carbonated beverages, regular and diet                                    Cranberry, grape and apple juices Sports drinks like Gatorade Lightly seasoned clear broth or consume(fat free) Sugar, honey syrup  Sample Menu Breakfast                                Lunch                                     Supper Cranberry juice                    Beef broth                            Chicken broth Jell-O                                     Grape juice                           Apple juice Coffee or tea                        Jell-O                                      Popsicle                                                Coffee or tea                        Coffee or tea  _____________________________________________________________________              [facility] - Preparing for Surgery Before surgery, you can play an important role.  Because skin is not sterile, your skin needs to be as free of germs as possible.  You can reduce the number of germs on your skin by washing with CHG (chlorahexidine gluconate) soap before surgery.  CHG is an antiseptic cleaner which kills germs and bonds with the skin to continue killing germs even after washing. Please DO NOT use if you have an allergy to CHG or antibacterial soaps.  If your skin  becomes reddened/irritated stop using the CHG and inform your nurse when  you arrive at Short Stay. Do not shave (including legs and underarms) for at least 48 hours prior to the first CHG shower.  You may shave your face/neck. Please follow these instructions carefully:  1.  Shower with CHG Soap the night before surgery and the  morning of Surgery.  2.  If you choose to wash your hair, wash your hair first as usual with your  normal  shampoo.  3.  After you shampoo, rinse your hair and body thoroughly to remove the  shampoo.                           4.  Use CHG as you would any other liquid soap.  You can apply chg directly  to the skin and wash                       Gently with a scrungie or clean washcloth.  5.  Apply the CHG Soap to your body ONLY FROM THE NECK DOWN.   Do not use on face/ open                           Wound or open sores. Avoid contact with eyes, ears mouth and genitals (private parts).                       Wash face,  Genitals (private parts) with your normal soap.             6.  Wash thoroughly, paying special attention to the area where your surgery  will be performed.  7.  Thoroughly rinse your body with warm water from the neck down.  8.  DO NOT shower/wash with your normal soap after using and rinsing off  the CHG Soap.                9.  Pat yourself dry with a clean towel.            10.  Wear clean pajamas.            11.  Place clean sheets on your bed the night of your first shower and do not  sleep with pets. Day of Surgery : Do not apply any lotions/deodorants the morning of surgery.  Please wear clean clothes to the hospital/surgery center.  FAILURE TO FOLLOW THESE INSTRUCTIONS MAY RESULT IN THE CANCELLATION OF YOUR SURGERY PATIENT SIGNATURE_________________________________  NURSE SIGNATURE__________________________________  ________________________________________________________________________

## 2019-04-28 NOTE — Telephone Encounter (Signed)
Patient has been scheduled with Aaron Edelman on 04/29/19.

## 2019-04-29 ENCOUNTER — Ambulatory Visit: Payer: Medicare Other | Admitting: Pulmonary Disease

## 2019-04-29 ENCOUNTER — Encounter (HOSPITAL_COMMUNITY): Payer: Self-pay

## 2019-04-29 ENCOUNTER — Other Ambulatory Visit: Payer: Self-pay

## 2019-04-29 ENCOUNTER — Ambulatory Visit (INDEPENDENT_AMBULATORY_CARE_PROVIDER_SITE_OTHER): Payer: Medicare Other

## 2019-04-29 ENCOUNTER — Encounter (HOSPITAL_COMMUNITY)
Admission: RE | Admit: 2019-04-29 | Discharge: 2019-04-29 | Disposition: A | Discharge status: Home / Self Care | Payer: Medicare Other | Source: Ambulatory Visit | Attending: Urology | Admitting: Urology

## 2019-04-29 ENCOUNTER — Encounter: Payer: Self-pay | Admitting: Pulmonary Disease

## 2019-04-29 DIAGNOSIS — J9611 Chronic respiratory failure with hypoxia: Secondary | ICD-10-CM | POA: Diagnosis not present

## 2019-04-29 DIAGNOSIS — J439 Emphysema, unspecified: Secondary | ICD-10-CM | POA: Insufficient documentation

## 2019-04-29 DIAGNOSIS — J449 Chronic obstructive pulmonary disease, unspecified: Secondary | ICD-10-CM

## 2019-04-29 DIAGNOSIS — Z01818 Encounter for other preprocedural examination: Secondary | ICD-10-CM | POA: Insufficient documentation

## 2019-04-29 DIAGNOSIS — Z8546 Personal history of malignant neoplasm of prostate: Secondary | ICD-10-CM | POA: Insufficient documentation

## 2019-04-29 DIAGNOSIS — I4510 Unspecified right bundle-branch block: Secondary | ICD-10-CM | POA: Diagnosis not present

## 2019-04-29 DIAGNOSIS — Z87891 Personal history of nicotine dependence: Secondary | ICD-10-CM | POA: Insufficient documentation

## 2019-04-29 DIAGNOSIS — Z79899 Other long term (current) drug therapy: Secondary | ICD-10-CM | POA: Diagnosis not present

## 2019-04-29 DIAGNOSIS — Z7951 Long term (current) use of inhaled steroids: Secondary | ICD-10-CM | POA: Insufficient documentation

## 2019-04-29 DIAGNOSIS — I10 Essential (primary) hypertension: Secondary | ICD-10-CM | POA: Diagnosis not present

## 2019-04-29 DIAGNOSIS — D494 Neoplasm of unspecified behavior of bladder: Secondary | ICD-10-CM | POA: Diagnosis not present

## 2019-04-29 DIAGNOSIS — Z9981 Dependence on supplemental oxygen: Secondary | ICD-10-CM | POA: Insufficient documentation

## 2019-04-29 DIAGNOSIS — I714 Abdominal aortic aneurysm, without rupture: Secondary | ICD-10-CM | POA: Diagnosis not present

## 2019-04-29 DIAGNOSIS — C61 Malignant neoplasm of prostate: Secondary | ICD-10-CM

## 2019-04-29 LAB — BASIC METABOLIC PANEL
Anion gap: 8 (ref 5–15)
BUN: 16 mg/dL (ref 8–23)
CO2: 27 mmol/L (ref 22–32)
Calcium: 8.9 mg/dL (ref 8.9–10.3)
Chloride: 107 mmol/L (ref 98–111)
Creatinine, Ser: 0.83 mg/dL (ref 0.61–1.24)
GFR calc Af Amer: >60 mL/min (ref >60)
GFR calc non Af Amer: >60 mL/min (ref >60)
Glucose, Bld: 102 mg/dL — ABNORMAL HIGH (ref 70–99)
Potassium: 3.9 mmol/L (ref 3.5–5.1)
Sodium: 142 mmol/L (ref 135–145)

## 2019-04-29 LAB — CBC
HCT: 46.8 % (ref 39.0–52.0)
Hemoglobin: 15.6 g/dL (ref 13.0–17.0)
MCH: 30.9 pg (ref 26.0–34.0)
MCHC: 33.3 g/dL (ref 30.0–36.0)
MCV: 92.7 fL (ref 80.0–100.0)
Platelets: 204 10*3/uL (ref 150–400)
RBC: 5.05 MIL/uL (ref 4.22–5.81)
RDW: 12.4 % (ref 11.5–15.5)
WBC: 7.4 10*3/uL (ref 4.0–10.5)
nRBC: 0 % (ref 0.0–0.2)

## 2019-04-29 NOTE — Progress Notes (Signed)
PCP - [place]. LOV: 10/29/18 Cardiologist -   Chest x-ray -  EKG -  Stress Test -  ECHO -  Cardiac Cath -   Sleep Study -  CPAP -   Fasting Blood Sugar -  Checks Blood Sugar _____ times a day  Blood Thinner Instructions: Aspirin Instructions: Last Dose:  Anesthesia review: Hx. COPD,on 2LO2,new diagnose of AAA.  Patient denies shortness of breath, fever, cough and chest pain at PAT appointment   Patient verbalized understanding of instructions that were given to them at the PAT appointment. Patient was also instructed that they will need to review over the PAT instructions again at home before surgery.

## 2019-04-29 NOTE — Assessment & Plan Note (Signed)
Peri-operative Assessment of Pulmonary Risk for Non-Thoracic Surgery:  Mark Cline, moderate risk of perioperative pulmonary complications is increased by:  Age greater than 65 years  COPD  Oxygen Use   Respiratory complications generally occur in 1% of ASA Class I patients, 5% of ASA Class II and 10% of ASA Class III-IV patients These complications rarely result in mortality and iclude postoperative pneumonia, atelectasis, pulmonary embolism, ARDS and increased time requiring postoperative mechanical ventilation.  Overall, I recommend proceeding with the surgery if the risk for respiratory complications are outweighed by the potential benefits. This will need to be discussed between the patient and surgeon.  To reduce risks of respiratory complications, I recommend: --Pre- and post-operative incentive spirometry performed frequently while awake --Avoiding use of pancuronium during anesthesia.  I have discussed the risk factors and recommendations above with the patient.

## 2019-04-29 NOTE — Assessment & Plan Note (Signed)
Plan: Continue oxygen therapy 

## 2019-04-29 NOTE — Patient Instructions (Addendum)
You were seen today by Lauraine Rinne, NP  for:   1. Pre-op examination  - DG Chest 2 View; Future  Peri-operative Assessment of Pulmonary Risk for Non-Thoracic Surgery:  ForMr. Seoane, moderate risk of perioperative pulmonary complications is increased by:  Age greater than 65 years  COPD  Oxygen Use   Respiratory complications generally occur in 1% of ASA Class I patients, 5% of ASA Class II and 10% of ASA Class III-IV patients These complications rarely result in mortality and iclude postoperative pneumonia, atelectasis, pulmonary embolism, ARDS and increased time requiring postoperative mechanical ventilation.  Overall, I recommend proceeding with the surgery if the risk for respiratory complications are outweighed by the potential benefits. This will need to be discussed between the patient and surgeon.  To reduce risks of respiratory complications, I recommend: --Pre- and post-operative incentive spirometry performed frequently while awake --Avoiding use of pancuronium during anesthesia.  I have discussed the risk factors and recommendations above with the patient.   2. COPD with chronic bronchitis and emphysema ([place])  - DG Chest 2 View; Future  Trelegy Ellipta  >>> 1 puff daily in the morning >>>rinse mouth out after use  >>> This inhaler contains 3 medications that help manage her respiratory status, contact our office if you cannot afford this medication or unable to remain on this medication  Only use your albuterol as a rescue medication to be used if you can't catch your breath by resting or doing a relaxed purse lip breathing pattern.  - The less you use it, the better it will work when you need it. - Ok to use up to 2 puffs  every 4 hours if you must but call for immediate appointment if use goes up over your usual need - Don't leave home without it !!  (think of it like the spare tire for your car)   Note your daily symptoms > remember "red flags" for COPD:     >>>Increase in cough >>>increase in sputum production >>>increase in shortness of breath or activity  intolerance.   If you notice these symptoms, please call the office to be seen.   3. Chronic respiratory failure with hypoxia (HCC)  Continue oxygen therapy as prescribed  >>>maintain oxygen saturations greater than 88 percent  >>>if unable to maintain oxygen saturations please contact the office  >>>do not smoke with oxygen  >>>can use nasal saline gel or nasal saline rinses to moisturize nose if oxygen causes dryness  4. Malignant neoplasm of prostate ([place]) 5. Hx of AAA  Keep follow-up with urology  Keep follow-up with cardiology   We recommend today:  Orders Placed This Encounter  Procedures  . DG Chest 2 View    Standing Status:   Future    Standing Expiration Date:   06/28/2020    Order Specific Question:   Reason for Exam (SYMPTOM  OR DIAGNOSIS REQUIRED)    Answer:   copd, pre op eval    Order Specific Question:   Preferred imaging location?    Answer:   Internal    Order Specific Question:   Radiology Contrast Protocol - do NOT remove file path    Answer:   [email or web address]   Orders Placed This Encounter  Procedures  . DG Chest 2 View   No orders of the defined types were placed in this encounter.   Follow Up:    No follow-ups on file.   Please do your part to reduce the spread of COVID-19:  Reduce your risk of any infection  and COVID19 by using the similar precautions used for avoiding the common cold or flu:  Marland Kitchen Wash your hands often with soap and warm water for at least 20 seconds.  If soap and water are not readily available, use an alcohol-based hand sanitizer with at least 60% alcohol.  . If coughing or sneezing, cover your mouth and nose by coughing or sneezing into the elbow areas of your shirt or coat, into a tissue or into your sleeve (not your hands). Langley Gauss A MASK when in public  . Avoid shaking  hands with others and consider head nods or verbal greetings only. . Avoid touching your eyes, nose, or mouth with unwashed hands.  . Avoid close contact with people who are sick. . Avoid places or events with large numbers of people in one location, like concerts or sporting events. . If you have some symptoms but not all symptoms, continue to monitor at home and seek medical attention if your symptoms worsen. . If you are having a medical emergency, call 911.   [place] / e-Visit: [email or web address]         [facility]: [facility]: [phone]                   [facility]: [phone]     It is flu season:   >>> Best ways to protect herself from the flu: Receive the yearly flu vaccine, practice good hand hygiene washing with soap and also using hand sanitizer when available, eat a nutritious meals, get adequate rest, hydrate appropriately   Please contact the office if your symptoms worsen or you have concerns that you are not improving.   Thank you for choosing [facility] for your healthcare, and for allowing [place] to partner with you on your healthcare journey. I am thankful to be able to provide care to you today.   Wyn Quaker FNP-C

## 2019-04-29 NOTE — Progress Notes (Signed)
Reviewed and agree with assessment/plan.   Rahmah Mccamy, MD [facility] 01/10/2016, 12:24 PM Pager:  [phone]  

## 2019-04-29 NOTE — Assessment & Plan Note (Signed)
Patient's formulary has changed so no longer on Symbicort 160 and now on trelegy Ellipta Confusion regarding inhalers Patient does not currently have a rescue inhaler  Plan: Continue Trelegy Ellipta We will send in albuterol rescue inhaler We will have patient present back to our office in 3 months, can consider at that time de-escalating from Trelegy Ellipta to potentially Anoro Ellipta or Breo Ellipta at that time. Chest x-ray today

## 2019-04-29 NOTE — Progress Notes (Signed)
@Patient  ID: Mark Cline, male    DOB: [birth year], 74 y.o.   MRN: [id number]  Chief Complaint  Patient presents with  . Follow-up    copd, pre op eval    Referring provider: [facility]  HPI:  74 year old male former smoker followed in our office for COPD and chronic respiratory failure  PMH: HTN, OA, AAA, Prostate cancer Smoker/ Smoking History: Former smoker Maintenance: Trelegy Ellipta Pt of: Dr. Halford Chessman  04/29/2019  - Visit   74 year old male former smoker followed in our office for COPD and chronic respiratory failure.  Last seen in October/2020 at that time patient was encouraged to remain on Symbicort 160 and continue oxygen use.  He was found to not be a candidate for portable oxygen concentrator.  He was instructed for the patient have follow-up with our office in 6 months.  Patient presenting today for surgical clearance.  Patient is scheduled on 05/06/2019 for a TURP with Dr. Jeffie Pollock with urology  Patient presenting to our office today reporting he is now currently on [place].  He is unsure why he was switched from Symbicort 160.  Upon chart reviewed it looks like patient's insurance is no longer covering Symbicort 160.  Patient's primary care switch to Trelegy Ellipta.  Patient just recently received a 90-day supply of this inhaler.  He does not have a rescue inhaler at home.  Patient also following with cardiology.  They are meeting next week to further discuss his AAA.  May need to consider surgical interventions.  Patient has no acute respiratory complaints at this time.  He feels that his breathing is at baseline.  Questionaires / Pulmonary Flowsheets:   MMRC: mMRC Dyspnea Scale mMRC Score  04/29/2019 1    Tests:   Pulmonary tests:  A1AT 09/10/18 >> 139, MM  ONO with RA 09/14/18 >> test time 13 hrs 58 min.  Baseline SpO2 91%, low SpO2 77%.  Spent 2 hrs 7 min with SpO2 < 88%.  Chest imaging:  CT chest 07/15/17 >> atherosclerosis, calcified Rt  hilar/paratracheal/subcarinal LN, advanced changes of emphysema, bronchial wall thickening, 1.5 cm density RUL, calcified granuloma RML and liver/spleen CT chest 12/09/17 >> stable RUL density, new patchy ASD lower lobes Lt > Rt  FENO:  No results found for: NITRICOXIDE  PFT: No flowsheet data found.  WALK:  SIX MIN WALK 10/29/2018 10/29/2018  Supplimental Oxygen during Test? (L/min) Yes Yes  O2 Flow Rate 2 2  Type - Pulse  Tech Comments: - O2 had to be turned up at the beginning of each lap. Patient would go back up to 90% - 95% but was not able to maintain, O2 dropped at the start of each new lap, and turned up accordingly on the POC being used. Patient also had to stop and rest after the start of each new new lap. O2 dropped to 88% once we approached the room and he was on 4L pulse. When patient was seated and back on 2L continuous, his O2 went back up to 98%  - 100%    Imaging: No results found.  Lab Results:  CBC    Component Value Date/Time   WBC 8.5 07/24/2017 1039   RBC 5.38 07/24/2017 1039   HGB 17.0 07/24/2017 1039   HCT 48.9 07/24/2017 1039   PLT 206 07/24/2017 1039   MCV 90.9 07/24/2017 1039   MCH 31.6 07/24/2017 1039   MCHC 34.8 07/24/2017 1039   RDW 12.7 07/24/2017 1039    BMET  Component Value Date/Time   NA 141 07/24/2017 1039   K 4.5 07/24/2017 1039   CL 105 07/24/2017 1039   CO2 27 07/24/2017 1039   GLUCOSE 86 07/24/2017 1039   BUN 22 07/24/2017 1039   CREATININE 0.80 01/12/2019 0916   CALCIUM 9.4 07/24/2017 1039   GFRNONAA >60 07/24/2017 1039   GFRAA >60 07/24/2017 1039    BNP No results found for: BNP  ProBNP No results found for: PROBNP  Specialty Problems      Pulmonary Problems   Chronic respiratory failure with hypoxia (HCC)   COPD with chronic bronchitis and emphysema (HCC)      No Known Allergies  Immunization History  Administered Date(s) Administered  . Influenza, High Dose Seasonal PF 10/17/2016, 10/22/2017,  09/28/2018    Past Medical History:  Diagnosis Date  . AAA (abdominal aortic aneurysm) ([place]) followed by pcp   infarenal , per CT 03-17-2017 , 4.4cm  . Arthritis    hands  . COPD with emphysema ([place])    followed by pcp. 2LO2  . Dyspnea on exertion   . History of amputation of finger age 69  . Hypertension   . Prostate cancer [facility]) urologist-  dr wrenn/  onologist-- dr Tammi Klippel   dx 02-28-2017-- STage T2b, Gleason 4+5, PSA 8.8-- scheduled for radiactive seed implants 07-31-2017 then IMRT  . Wears glasses   . Wears partial dentures    upper    Tobacco History: Social History   Tobacco Use  Smoking Status Former Smoker  . Packs/day: 0.50  . Years: 60.00  . Pack years: 30.00  . Types: Cigarettes  . Quit date: 07/28/2016  . Years since quitting: 2.7  Smokeless Tobacco Never Used   Counseling given: Yes   Continue to not smoke  Outpatient Encounter Medications as of 04/29/2019  Medication Sig  . Fluticasone-Umeclidin-Vilant (TRELEGY ELLIPTA) 100-62.5-25 MCG/INH AEPB Inhale 1 puff into the lungs daily.  Marland Kitchen lisinopril (PRINIVIL,ZESTRIL) 10 MG tablet Take 10 mg by mouth daily.   . [DISCONTINUED] budesonide-formoterol (SYMBICORT) 160-4.5 MCG/ACT inhaler Inhale 2 puffs into the lungs 2 (two) times daily. (Patient not taking: Reported on 04/22/2019)   No facility-administered encounter medications on file as of 04/29/2019.     Review of Systems  Review of Systems  Constitutional: Negative for activity change, chills, fatigue, fever and unexpected weight change.  HENT: Negative for postnasal drip, rhinorrhea, sinus pressure, sinus pain and sore throat.   Eyes: Negative.   Respiratory: Negative for cough, shortness of breath and wheezing.   Cardiovascular: Negative for chest pain and palpitations.  Gastrointestinal: Negative for constipation, diarrhea, nausea and vomiting.  Endocrine: Negative.   Genitourinary: Negative.   Musculoskeletal: Negative.   Skin: Negative.    Neurological: Negative for dizziness and headaches.  Psychiatric/Behavioral: Negative.  Negative for dysphoric mood. The patient is not nervous/anxious.   All other systems reviewed and are negative.    Physical Exam  BP 118/72 (BP Location: Left Arm, Patient Position: Sitting, Cuff Size: Normal)   Pulse 74   Temp 98.4 F (36.9 C) (Temporal)   Ht 5\' 10"  (1.778 m)   Wt 185 lb (83.9 kg)   SpO2 98% Comment: on 2L pulsed  BMI 26.54 kg/m   Wt Readings from Last 5 Encounters:  04/29/19 185 lb (83.9 kg)  04/29/19 181 lb (82.1 kg)  10/29/18 183 lb 3.2 oz (83.1 kg)  10/14/18 183 lb 1.6 oz (83.1 kg)  09/10/18 184 lb (83.5 kg)    BMI Readings from Last  5 Encounters:  04/29/19 26.54 kg/m  04/29/19 25.97 kg/m  10/29/18 26.29 kg/m  10/14/18 26.27 kg/m  09/10/18 26.40 kg/m     Physical Exam Vitals and nursing note reviewed.  Constitutional:      General: He is not in acute distress.    Appearance: Normal appearance. He is obese.  HENT:     Head: Normocephalic and atraumatic.     Right Ear: Hearing and external ear normal.     Left Ear: Hearing and external ear normal.     Nose: Nose normal. No mucosal edema or rhinorrhea.     Right Turbinates: Not enlarged.     Left Turbinates: Not enlarged.     Mouth/Throat:     Mouth: Mucous membranes are dry.     Pharynx: Oropharynx is clear. No oropharyngeal exudate.  Eyes:     Pupils: Pupils are equal, round, and reactive to light.  Cardiovascular:     Rate and Rhythm: Normal rate and regular rhythm.     Pulses: Normal pulses.     Heart sounds: Normal heart sounds. No murmur.  Pulmonary:     Effort: Pulmonary effort is normal.     Breath sounds: Normal breath sounds. No decreased breath sounds, wheezing or rales.  Musculoskeletal:     Cervical back: Normal range of motion.     Right lower leg: No edema.     Left lower leg: No edema.  Lymphadenopathy:     Cervical: No cervical adenopathy.  Skin:    General: Skin is warm and  dry.     Capillary Refill: Capillary refill takes less than 2 seconds.     Findings: No erythema or rash.  Neurological:     General: No focal deficit present.     Mental Status: He is alert and oriented to person, place, and time.     Motor: No weakness.     Coordination: Coordination normal.     Gait: Gait is intact. Gait normal.  Psychiatric:        Mood and Affect: Mood normal.        Behavior: Behavior normal. Behavior is cooperative.        Thought Content: Thought content normal.        Judgment: Judgment normal.       Assessment & Plan:   Chronic respiratory failure with hypoxia (HCC) Plan: Continue oxygen therapy  COPD with chronic bronchitis and emphysema ([place]) Patient's formulary has changed so no longer on Symbicort 160 and now on trelegy Ellipta Confusion regarding inhalers Patient does not currently have a rescue inhaler  Plan: Continue Trelegy Ellipta We will send in albuterol rescue inhaler We will have patient present back to our office in 3 months, can consider at that time de-escalating from [place] to potentially Anoro Ellipta or Breo Ellipta at that time. Chest x-ray today    Pre-op examination Peri-operative Assessment of Pulmonary Risk for Non-Thoracic Surgery:  ForMr. Truman, moderate risk of perioperative pulmonary complications is increased by:  Age greater than 65 years  COPD  Oxygen Use   Respiratory complications generally occur in 1% of ASA Class I patients, 5% of ASA Class II and 10% of ASA Class III-IV patients These complications rarely result in mortality and iclude postoperative pneumonia, atelectasis, pulmonary embolism, ARDS and increased time requiring postoperative mechanical ventilation.  Overall, I recommend proceeding with the surgery if the risk for respiratory complications are outweighed by the potential benefits. This will need to be discussed between the  patient and Psychologist, sport and exercise.  To reduce risks of respiratory  complications, I recommend: --Pre- and post-operative incentive spirometry performed frequently while awake --Avoiding use of pancuronium during anesthesia.  I have discussed the risk factors and recommendations above with the patient.     Return in about 3 months (around 07/29/2019), or if symptoms worsen or fail to improve, for Follow up with Dr. Halford Chessman, Follow up with Wyn Quaker FNP-C.   Lauraine Rinne, NP 04/29/2019   This appointment required 34 minutes of patient care (this includes precharting, chart review, review of results, face-to-face care, etc.).

## 2019-05-03 ENCOUNTER — Other Ambulatory Visit (HOSPITAL_COMMUNITY)
Admission: RE | Admit: 2019-05-03 | Discharge: 2019-05-03 | Disposition: A | Discharge status: Home / Self Care | Payer: Medicare Other | Source: Ambulatory Visit | Attending: Urology | Admitting: Urology

## 2019-05-03 DIAGNOSIS — Z20822 Contact with and (suspected) exposure to covid-19: Secondary | ICD-10-CM | POA: Insufficient documentation

## 2019-05-03 DIAGNOSIS — Z01812 Encounter for preprocedural laboratory examination: Secondary | ICD-10-CM | POA: Diagnosis not present

## 2019-05-03 LAB — SARS CORONAVIRUS 2 (TAT 6-24 HRS): SARS Coronavirus 2: NEGATIVE

## 2019-05-03 NOTE — Progress Notes (Addendum)
Anesthesia Chart Review   Case: [id number] Date/Time: 05/06/19 1045   Procedure: TRANSURETHRAL RESECTION OF BLADDER TUMOR WITH GEMCITABIN (N/A )   Anesthesia type: IV Sedation ([facility] Only)   Pre-op diagnosis: BLADDER TUMOR   Location: [place] [facility]   Surgeons: Irine Seal, MD      DISCUSSION:74 y.o. former smoker (30 pack smoker, quit 07/28/16) with h/o HTN, COPD (on 2L O2), AAA (5 cm on CT 01/12/2019), prostate cancer, bladder tumor scheduled for above procedure 05/06/19 with Dr. Irine Seal.   Pt last seen by pulmonology 04/29/2019.  Per OV note, "Mr. Hellickson, moderate risk of perioperative pulmonary complications is increased by:             Age greater than 76 years             COPD             Oxygen Use  Respiratory complications generally occur in 1% of ASA Class I patients, 5% of ASA Class II and 10% of ASA Class III-IV patients These complications rarely result in mortality and iclude postoperative pneumonia, atelectasis, pulmonary embolism, ARDS and increased time requiring postoperative mechanical ventilation Overall, I recommend proceeding with the surgery if the risk for respiratory complications are outweighed by the potential benefits. This will need to be discussed between the patient and surgeon. To reduce risks of respiratory complications, I recommend: --Pre- and post-operative incentive spirometry performed frequently while awake --Avoiding use of pancuronium during anesthesia. I have discussed the risk factors and recommendations above with the patient."  Seen by Vascular Surgeon 05/04/2019 for evaluation of AAA.  Per OV note, "Reviewed his CT scan from December 2020 and also ultrasound from today.  Ultrasound showed no enlargement from his 5.0 cm aneurysm.  I had a very long discussion with the patient and his wife present.  Explained the significance of his asymptomatic aneurysm.  Explained that current recommendation for threshold would be 5.5 cm unless he had  rapid expansion.  He does have a typical expansion with the documented changes based on his CT scan.  I have recommended that we see him again in 6 months with a CT scan for continued follow-up."  Anticipate pt can proceed with planned procedure barring acute status change.   VS: BP (!) 142/74 (BP Location: Right Arm)   Pulse 77   Temp 36.7 C (Oral)   Resp 18   Ht 5\' 10"  (1.778 m)   Wt 85.8 kg   SpO2 97%   BMI 27.14 kg/m   PROVIDERS: [facility] is PCP   [name], MD is Pulmonologist   Curt Jews, MD is Vascular Surgeon  LABS: Labs reviewed: Acceptable for surgery. (all labs ordered are listed, but only abnormal results are displayed)  Labs Reviewed  BASIC METABOLIC PANEL - Abnormal; Notable for the following components:      Result Value   Glucose, Bld 102 (*)    All other components within normal limits  CBC     IMAGES:   EKG: 04/29/2019 Rate 79 bpm Normal sinus rhythm Rightward axis Right bundle branch block No significant change since last tracing  CV:  Past Medical History:  Diagnosis Date  . AAA (abdominal aortic aneurysm) ([place]) followed by pcp   infarenal , per CT 03-17-2017 , 4.4cm  . Arthritis    hands  . COPD with emphysema ([place])    followed by pcp. 2LO2  . Dyspnea on exertion   . History of amputation  of finger age 88  . Hypertension   . Prostate cancer [facility]) urologist-  dr wrenn/  onologist-- dr Tammi Klippel   dx 02-28-2017-- STage T2b, Gleason 4+5, PSA 8.8-- scheduled for radiactive seed implants 07-31-2017 then IMRT  . Wears glasses   . Wears partial dentures    upper    Past Surgical History:  Procedure Laterality Date  . APPENDECTOMY  1960s  . LAPAROSCOPIC CHOLECYSTECTOMY  1992  . PROSTATE BIOPSY  02-28-2017   dr Jeffie Pollock office  . RADIOACTIVE SEED IMPLANT N/A 07/31/2017   Procedure: RADIOACTIVE SEED IMPLANT/BRACHYTHERAPY IMPLANT;  Surgeon: Irine Seal, MD;  [facility];  Service: Urology;   Laterality: N/A;  . SPACE OAR INSTILLATION N/A 07/31/2017   Procedure: SPACE OAR INSTILLATION;  Surgeon: Irine Seal, MD;  Location: [facility];  Service: Urology;  Laterality: N/A;  . TONSILLECTOMY  age 28    MEDICATIONS: . Fluticasone-Umeclidin-Vilant (TRELEGY ELLIPTA) 100-62.5-25 MCG/INH AEPB  . lisinopril (PRINIVIL,ZESTRIL) 10 MG tablet   No current facility-administered medications for this encounter.    Maia Plan [facility] Pre-Surgical Testing [phone] 05/03/19  12:41 PM

## 2019-05-04 ENCOUNTER — Ambulatory Visit (INDEPENDENT_AMBULATORY_CARE_PROVIDER_SITE_OTHER): Payer: Medicare Other | Admitting: Vascular Surgery

## 2019-05-04 ENCOUNTER — Other Ambulatory Visit: Payer: Self-pay

## 2019-05-04 ENCOUNTER — Ambulatory Visit (HOSPITAL_COMMUNITY)
Admission: RE | Admit: 2019-05-04 | Discharge: 2019-05-04 | Disposition: A | Discharge status: Home / Self Care | Payer: Medicare Other | Source: Ambulatory Visit | Attending: Vascular Surgery | Admitting: Vascular Surgery

## 2019-05-04 ENCOUNTER — Encounter: Payer: Self-pay | Admitting: Vascular Surgery

## 2019-05-04 VITALS — BP 149/82 | HR 75 | Temp 97.6°F | Resp 20 | Ht 70.0 in | Wt 185.0 lb

## 2019-05-04 DIAGNOSIS — I714 Abdominal aortic aneurysm, without rupture, unspecified: Secondary | ICD-10-CM

## 2019-05-04 NOTE — Progress Notes (Signed)
[facility] [place]  Patient name: Mark Cline MRN: [id number] DOB: 07/28/1945 Sex: male  REASON FOR CONSULT: Discuss known abdominal aortic aneurysm  HPI: Mark Cline is a 74 y.o. male, who is here today for discussion of known abdominal aortic aneurysm.  He reports that this is been followed since it was in the 3 cm range.  I have documentation of a March 2019 study where the maximal diameter was 4.4 cm.  His most recent CT scan in December 2020 revealed a 5.0 cm aneurysm.  He has no symptoms referable to this.  He has no history of peripheral artery aneurysm.  He has no history of cardiac disease.  He has severe COPD and is on chronic oxygen therapy.  Past Medical History:  Diagnosis Date  . AAA (abdominal aortic aneurysm) ([place]) followed by pcp   infarenal , per CT 03-17-2017 , 4.4cm  . Arthritis    hands  . COPD with emphysema ([place])    followed by pcp. 2LO2  . Dyspnea on exertion   . History of amputation of finger age 38  . Hypertension   . Prostate cancer [facility]) urologist-  dr wrenn/  onologist-- dr Tammi Klippel   dx 02-28-2017-- STage T2b, Gleason 4+5, PSA 8.8-- scheduled for radiactive seed implants 07-31-2017 then IMRT  . Wears glasses   . Wears partial dentures    upper    Family History  Problem Relation Age of Onset  . Cancer Father 47       lung    SOCIAL HISTORY: Social History   Socioeconomic History  . Marital status: Married    Spouse name: Sebastain Fishbaugh  . Number of children: 4  . Years of education: Not on file  . Highest education level: Not on file  Occupational History  . Occupation: Truck Geophysicist/field seismologist    Comment: truck Geophysicist/field seismologist  Tobacco Use  . Smoking status: Former Smoker    Packs/day: 0.50    Years: 60.00    Pack years: 30.00    Types: Cigarettes    Quit date: 07/28/2016    Years since quitting: 2.7  . Smokeless tobacco: Never Used  Substance and Sexual Activity  . Alcohol use: No    . Drug use: No  . Sexual activity: Not on file  Other Topics Concern  . Not on file  Social History Narrative  . Not on file   Social Determinants of Health   Financial Resource Strain:   . Difficulty of Paying Living Expenses:   Food Insecurity:   . Worried About Charity fundraiser in the Last Year:   . Arboriculturist in the Last Year:   Transportation Needs:   . Film/video editor (Medical):   Marland Kitchen Lack of Transportation (Non-Medical):   Physical Activity:   . Days of Exercise per Week:   . Minutes of Exercise per Session:   Stress:   . Feeling of Stress :   Social Connections:   . Frequency of Communication with Friends and Family:   . Frequency of Social Gatherings with Friends and Family:   . Attends Religious Services:   . Active Member of Clubs or Organizations:   . Attends Archivist Meetings:   Marland Kitchen Marital Status:   Intimate Partner Violence:   . Fear of Current or Ex-Partner:   . Emotionally Abused:   Marland Kitchen Physically Abused:   . Sexually Abused:     No Known Allergies  Current  Outpatient Medications  Medication Sig Dispense Refill  . Fluticasone-Umeclidin-Vilant (TRELEGY ELLIPTA) 100-62.5-25 MCG/INH AEPB Inhale 1 puff into the lungs daily.    Marland Kitchen lisinopril (PRINIVIL,ZESTRIL) 10 MG tablet Take 10 mg by mouth daily.      No current facility-administered medications for this visit.    REVIEW OF SYSTEMS:  [X]  denotes positive finding, [ ]  denotes negative finding Cardiac  Comments:  Chest pain or chest pressure:    Shortness of breath upon exertion: x   Short of breath when lying flat: x   Irregular heart rhythm:        Vascular    Pain in calf, thigh, or hip brought on by ambulation:    Pain in feet at night that wakes you up from your sleep:     Blood clot in your veins:    Leg swelling:         Pulmonary    Oxygen at home:    Productive cough:     Wheezing:         Neurologic    Sudden weakness in arms or legs:     Sudden numbness  in arms or legs:     Sudden onset of difficulty speaking or slurred speech:    Temporary loss of vision in one eye:     Problems with dizziness:         Gastrointestinal    Blood in stool:     Vomited blood:         Genitourinary    Burning when urinating:     Blood in urine: x       Psychiatric    Major depression:         Hematologic    Bleeding problems:    Problems with blood clotting too easily:        Skin    Rashes or ulcers:        Constitutional    Fever or chills:      PHYSICAL EXAM: Vitals:   05/04/19 0859  BP: (!) 149/82  Pulse: 75  Resp: 20  Temp: 97.6 F (36.4 C)  SpO2: 97%  Weight: 185 lb (83.9 kg)  Height: 5\' 10"  (1.778 m)    GENERAL: The patient is a well-nourished male, in no acute distress. The vital signs are documented above. CARDIOVASCULAR: Carotid arteries without bruits bilaterally.  2+ radial and 2+ femoral pulses.  I do not palpate popliteal pulses or pedal pulses. PULMONARY: There is good air exchange  ABDOMEN: Soft and non-tender.  I do not feel abdominal aneurysm MUSCULOSKELETAL: There are no major deformities or cyanosis. NEUROLOGIC: No focal weakness or paresthesias are detected. SKIN: There are no ulcers or rashes noted. PSYCHIATRIC: The patient has a normal affect.  DATA:  Reviewed his CT scan from December 2020 and also ultrasound from today.  Ultrasound showed no enlargement from his 5.0 cm aneurysm  MEDICAL ISSUES: I had a very long discussion with the patient and his wife present.  Explained the significance of his asymptomatic aneurysm.  Explained that current recommendation for threshold would be 5.5 cm unless he had rapid expansion.  He does have a typical expansion with the documented changes based on his CT scan.  I have recommended that we see him again in 6 months with a CT scan for continued follow-up.   Rosetta Posner, MD FACS [facility] Tel [phone] Pager [phone]

## 2019-05-05 NOTE — H&P (Signed)
I have blood in my urine.     Mark Cline returns today for repeat cystocopy to reassess the lesion at the left UO which I felt was most consistent with radiation changes. He had some atypia on cytology at his last visit.     ALLERGIES: No Allergies    MEDICATIONS: Lisinopril  Inhaler     Notes: He is on a new med for COPD.    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 If he has clinically significant hematuria, he will need a CT and possible cystoscopy. - 12/18/2018 Personal Hx Urinary Tract Infections, I will get his recent culture from Dr. Manuella Ghazi for review. - 12/18/2018 Prostate Cancer, His PSA remains suppressed with a castrate testosterone. He will get his last Lupron today and f/u in 6 months with labs. - 12/18/2018, PSA is well suppressed and he tolerated the Lupron well. He will return in 6 months with labs. , - 06/12/2018, He is doing well but didn't get a PSA prior to this visit and needs a new PA for Lupron. He will have a PSA and T checked today and return next week for Lupron. , - 12/05/2017, - 09/18/2017, He is doing well on treatment but does have some fatigue and frequency. I have encouraged him to try to walk daily and lift some weights. He was encouraged to begin a Calcium and Vit D supplement. He has f/u on 11/22 for labs and Lupron. , - 08/22/2017, - 05/30/2017, He will return in a month for a PSA and testosterone and will be converted to Lupron which he will continue for 18-24 months. I will await instructions from Dr. Tammi Klippel for scheduling fiducial markers and SpaceO[employer]. , -  04/25/2017, - 2019 Urinary Urgency, His voiding symptoms have improved. - 12/05/2017 Epididymitis (Improving), Right - 09/18/2017 Elevated PSA - 2019 Prostate nodule w/o LUTS - 2019    NON-GU PMH: Abdominal aortic aneurysm, He has a 5cm AAA and reports that that has been under surveillance by Dr. Manuella Ghazi. I will send the CT report to Dr. Manuella Ghazi to make sure he is aware of the current size of the AAA. - 01/14/2019 Other fatigue - 08/22/2017 Arthritis COPD Hypertension    FAMILY HISTORY: No Family History    SOCIAL HISTORY: Marital Status: Married Current Smoking Status: Patient smokes occasionally. Has smoked since 01/18/2017.   Tobacco Use Assessment Completed: Used Tobacco in last 30 days? Has never drank.  Drinks 1 caffeinated drink per day.    REVIEW OF SYSTEMS:    GU Review Male:   Patient reports frequent urination, hard to postpone urination, get up at night to urinate, and leakage of urine. Patient denies burning/ pain with urination, stream starts and stops, trouble starting your stream, have to strain to urinate , erection problems, and penile pain.  Gastrointestinal (Upper):   Patient denies nausea, vomiting, and indigestion/ heartburn.  Gastrointestinal (Lower):   Patient denies diarrhea and constipation.  Constitutional:   Patient denies fever, night sweats, weight loss, and fatigue.  Skin:   Patient denies skin rash/ lesion and itching.  Eyes:   Patient denies blurred vision  and double vision.  Ears/ Nose/ Throat:   Patient reports sinus problems. Patient denies sore throat.  Hematologic/Lymphatic:   Patient denies swollen glands and easy bruising.  Cardiovascular:   Patient denies leg swelling and chest pains.  Respiratory:   Patient reports shortness of breath. Patient denies cough.  Endocrine:   Patient denies excessive thirst.  Musculoskeletal:   Patient denies back pain and joint pain.  Neurological:   Patient denies headaches and dizziness.  Psychologic:   Patient  denies depression and anxiety.   VITAL SIGNS:      04/14/2019 10:55 AM  Weight 181 lb / 82.1 kg  Height 70 in / 177.8 cm  BP 115/74 mmHg  Heart Rate 102 /min  Temperature 99.1 F / 37.2 C  BMI 26.0 kg/m   PAST DATA REVIEWED:  Source Of History:  Patient   12/09/18 06/05/18 12/05/17 05/22/17 02/07/17  PSA  Total PSA < 0.1 ng/dl < 0.1 ng/dl < 0.1 ng/dl 2.8 ng/dl 8.8 ng/dl    12/09/18 06/05/18 12/05/17 05/22/17  Hormones  Testosterone, Total 18 pg/dL 12 pg/dL < 10 pg/dL 20 pg/dL    PROCEDURES:         Flexible Cystoscopy - 52000  Risks, benefits, and some of the potential complications of the procedure were discussed. 23ml of 2% lidocaine jelly was instilled intraurethrally.  Cipro 500mg  given for antibiotic prophylaxis.     Meatus:  Normal size. Normal location. Normal condition.  Urethra:  No strictures.  External Sphincter:  Normal.  Verumontanum:  Normal.  Prostate:  Obstructing. Moderate hyperplasia.  Bladder Neck:  Non-obstructing.  Ureteral Orifices:  Normal location. Normal size. Normal shape. Effluxed clear urine.  Bladder:  Erythematous mucosa with some cobblestoning lateral to the left UO about 2cm in diameter that is worrisome for CIS. No trabeculation. No stones.      The procedure was well tolerated and there were no complications.         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 He had cytologic atypia as well. I am going to get him set up for cystoscopy with TURBT and possible Gemcitabine. I reviewed the risks in detail including bleeding, infection, bladder wall or ureteral injury, strictures, chemical cystitis, thrombotic events and anesthetic complications. I will get him cleared by Dr. Halford Chessman.      PLAN:           Orders Labs Urine Cytology, Urine Cytology          Schedule Return Visit/Planned Activity: ASAP - Schedule Surgery             Note: TURBT and gemcitabine.   Return Visit/Planned Activity: Keep Scheduled Appointment - Office Visit  Note: He has f/u in [place].   Procedure: Unspecified Date - Cystoscopy TURBT <2 cm - 57903 Notes: Next available.

## 2019-05-06 ENCOUNTER — Ambulatory Visit (HOSPITAL_COMMUNITY): Payer: Medicare Other | Admitting: Certified Registered Nurse Anesthetist

## 2019-05-06 ENCOUNTER — Encounter (HOSPITAL_COMMUNITY): Payer: Self-pay | Admitting: Urology

## 2019-05-06 ENCOUNTER — Ambulatory Visit (HOSPITAL_COMMUNITY)
Admission: RE | Admit: 2019-05-06 | Discharge: 2019-05-06 | Disposition: A | Discharge status: Home / Self Care | Payer: Medicare Other | Attending: Urology | Admitting: Urology

## 2019-05-06 ENCOUNTER — Encounter (HOSPITAL_COMMUNITY)
Admission: RE | Disposition: A | Discharge status: Home / Self Care | Payer: Self-pay | Source: Home / Self Care | Attending: Urology

## 2019-05-06 ENCOUNTER — Other Ambulatory Visit: Payer: Self-pay

## 2019-05-06 ENCOUNTER — Ambulatory Visit (HOSPITAL_COMMUNITY): Payer: Medicare Other | Admitting: Physician Assistant

## 2019-05-06 DIAGNOSIS — Z923 Personal history of irradiation: Secondary | ICD-10-CM | POA: Diagnosis not present

## 2019-05-06 DIAGNOSIS — N329 Bladder disorder, unspecified: Secondary | ICD-10-CM | POA: Diagnosis present

## 2019-05-06 DIAGNOSIS — Z79899 Other long term (current) drug therapy: Secondary | ICD-10-CM | POA: Insufficient documentation

## 2019-05-06 DIAGNOSIS — D090 Carcinoma in situ of bladder: Secondary | ICD-10-CM | POA: Insufficient documentation

## 2019-05-06 DIAGNOSIS — D494 Neoplasm of unspecified behavior of bladder: Secondary | ICD-10-CM | POA: Diagnosis not present

## 2019-05-06 DIAGNOSIS — J449 Chronic obstructive pulmonary disease, unspecified: Secondary | ICD-10-CM | POA: Insufficient documentation

## 2019-05-06 DIAGNOSIS — Z8546 Personal history of malignant neoplasm of prostate: Secondary | ICD-10-CM | POA: Insufficient documentation

## 2019-05-06 DIAGNOSIS — Z9981 Dependence on supplemental oxygen: Secondary | ICD-10-CM | POA: Diagnosis not present

## 2019-05-06 DIAGNOSIS — I10 Essential (primary) hypertension: Secondary | ICD-10-CM | POA: Insufficient documentation

## 2019-05-06 HISTORY — PX: TRANSURETHRAL RESECTION OF BLADDER TUMOR WITH MITOMYCIN-C: SHX6459

## 2019-05-06 SURGERY — TRANSURETHRAL RESECTION OF BLADDER TUMOR WITH MITOMYCIN-C
Anesthesia: General | Site: Bladder

## 2019-05-06 MED ORDER — EPHEDRINE SULFATE-NACL 50-0.9 MG/10ML-% IV SOSY
PREFILLED_SYRINGE | INTRAVENOUS | Status: DC | PRN
  Administered 2019-05-06: 5 mg via INTRAVENOUS
  Administered 2019-05-06: 10 mg via INTRAVENOUS

## 2019-05-06 MED ORDER — ALBUTEROL SULFATE HFA 108 (90 BASE) MCG/ACT IN AERS
INHALATION_SPRAY | RESPIRATORY_TRACT | Status: AC
  Filled 2019-05-06: qty 6.7

## 2019-05-06 MED ORDER — LIDOCAINE 2% (20 MG/ML) 5 ML SYRINGE
INTRAMUSCULAR | Status: DC | PRN
  Administered 2019-05-06: 50 mg via INTRAVENOUS

## 2019-05-06 MED ORDER — FENTANYL CITRATE (PF) 100 MCG/2ML IJ SOLN: 25.0000 ug | INTRAMUSCULAR | Status: DC | PRN

## 2019-05-06 MED ORDER — LACTATED RINGERS IV SOLN: INTRAVENOUS | Status: DC

## 2019-05-06 MED ORDER — SODIUM CHLORIDE 0.9 % IR SOLN
Status: DC | PRN
  Administered 2019-05-06: 1 via INTRAVESICAL

## 2019-05-06 MED ORDER — GEMCITABINE CHEMO FOR BLADDER INSTILLATION 2000 MG
2000.0000 mg | Freq: Once | INTRAVENOUS | Status: AC
  Administered 2019-05-06: 2000 mg via INTRAVESICAL
  Filled 2019-05-06: qty 2000

## 2019-05-06 MED ORDER — CEFAZOLIN SODIUM-DEXTROSE 2-4 GM/100ML-% IV SOLN
2.0000 g | INTRAVENOUS | Status: DC
  Filled 2019-05-06: qty 100

## 2019-05-06 MED ORDER — FENTANYL CITRATE (PF) 100 MCG/2ML IJ SOLN
INTRAMUSCULAR | Status: AC
  Filled 2019-05-06: qty 2

## 2019-05-06 MED ORDER — LIDOCAINE 2% (20 MG/ML) 5 ML SYRINGE
INTRAMUSCULAR | Status: AC
  Filled 2019-05-06: qty 5

## 2019-05-06 MED ORDER — SODIUM CHLORIDE 0.9 % IV SOLN: 250.0000 mL | INTRAVENOUS | Status: DC | PRN

## 2019-05-06 MED ORDER — ACETAMINOPHEN 650 MG RE SUPP
650.0000 mg | RECTAL | Status: DC | PRN
  Filled 2019-05-06: qty 1

## 2019-05-06 MED ORDER — ROCURONIUM BROMIDE 10 MG/ML (PF) SYRINGE
PREFILLED_SYRINGE | INTRAVENOUS | Status: DC | PRN
  Administered 2019-05-06: 40 mg via INTRAVENOUS

## 2019-05-06 MED ORDER — PROPOFOL 10 MG/ML IV BOLUS
INTRAVENOUS | Status: DC | PRN
  Administered 2019-05-06: 160 mg via INTRAVENOUS

## 2019-05-06 MED ORDER — ALBUTEROL SULFATE HFA 108 (90 BASE) MCG/ACT IN AERS
INHALATION_SPRAY | RESPIRATORY_TRACT | Status: DC | PRN
  Administered 2019-05-06 (×2): 2 via RESPIRATORY_TRACT

## 2019-05-06 MED ORDER — PROPOFOL 10 MG/ML IV BOLUS
INTRAVENOUS | Status: AC
  Filled 2019-05-06: qty 40

## 2019-05-06 MED ORDER — DEXAMETHASONE SODIUM PHOSPHATE 10 MG/ML IJ SOLN
INTRAMUSCULAR | Status: DC | PRN
  Administered 2019-05-06: 8 mg via INTRAVENOUS

## 2019-05-06 MED ORDER — PHENYLEPHRINE HCL-NACL 10-0.9 MG/250ML-% IV SOLN
INTRAVENOUS | Status: DC | PRN
  Administered 2019-05-06: 25 ug/min via INTRAVENOUS

## 2019-05-06 MED ORDER — EPHEDRINE 5 MG/ML INJ
INTRAVENOUS | Status: AC
  Filled 2019-05-06: qty 10

## 2019-05-06 MED ORDER — DEXAMETHASONE SODIUM PHOSPHATE 10 MG/ML IJ SOLN
INTRAMUSCULAR | Status: AC
  Filled 2019-05-06: qty 1

## 2019-05-06 MED ORDER — ONDANSETRON HCL 4 MG/2ML IJ SOLN
INTRAMUSCULAR | Status: DC | PRN
  Administered 2019-05-06: 4 mg via INTRAVENOUS

## 2019-05-06 MED ORDER — FENTANYL CITRATE (PF) 100 MCG/2ML IJ SOLN
INTRAMUSCULAR | Status: DC | PRN
  Administered 2019-05-06 (×2): 25 ug via INTRAVENOUS
  Administered 2019-05-06: 50 ug via INTRAVENOUS

## 2019-05-06 MED ORDER — PROMETHAZINE HCL 25 MG/ML IJ SOLN: 6.2500 mg | INTRAMUSCULAR | Status: DC | PRN

## 2019-05-06 MED ORDER — ONDANSETRON HCL 4 MG/2ML IJ SOLN
INTRAMUSCULAR | Status: AC
  Filled 2019-05-06: qty 2

## 2019-05-06 MED ORDER — SODIUM CHLORIDE 0.9% FLUSH: 3.0000 mL | INTRAVENOUS | Status: DC | PRN

## 2019-05-06 MED ORDER — OXYCODONE HCL 5 MG PO TABS
ORAL_TABLET | ORAL | Status: AC
  Administered 2019-05-06: 13:00:00 5 mg via ORAL
  Filled 2019-05-06: qty 1

## 2019-05-06 MED ORDER — PHENYLEPHRINE 40 MCG/ML (10ML) SYRINGE FOR IV PUSH (FOR BLOOD PRESSURE SUPPORT)
PREFILLED_SYRINGE | INTRAVENOUS | Status: AC
  Filled 2019-05-06: qty 10

## 2019-05-06 MED ORDER — SUCCINYLCHOLINE CHLORIDE 200 MG/10ML IV SOSY
PREFILLED_SYRINGE | INTRAVENOUS | Status: AC
  Filled 2019-05-06: qty 10

## 2019-05-06 MED ORDER — PHENYLEPHRINE HCL (PRESSORS) 10 MG/ML IV SOLN
INTRAVENOUS | Status: AC
  Filled 2019-05-06: qty 1

## 2019-05-06 MED ORDER — SUGAMMADEX SODIUM 200 MG/2ML IV SOLN
INTRAVENOUS | Status: DC | PRN
  Administered 2019-05-06: 200 mg via INTRAVENOUS

## 2019-05-06 MED ORDER — SODIUM CHLORIDE 0.9% FLUSH: 3.0000 mL | Freq: Two times a day (BID) | INTRAVENOUS | Status: DC

## 2019-05-06 MED ORDER — MORPHINE SULFATE (PF) 4 MG/ML IV SOLN: 2.0000 mg | INTRAVENOUS | Status: DC | PRN

## 2019-05-06 MED ORDER — OXYCODONE HCL 5 MG PO TABS: 5.0000 mg | ORAL_TABLET | ORAL | Status: DC | PRN

## 2019-05-06 MED ORDER — ROCURONIUM BROMIDE 10 MG/ML (PF) SYRINGE
PREFILLED_SYRINGE | INTRAVENOUS | Status: AC
  Filled 2019-05-06: qty 10

## 2019-05-06 MED ORDER — ACETAMINOPHEN 325 MG PO TABS: 650.0000 mg | ORAL_TABLET | ORAL | Status: DC | PRN

## 2019-05-06 SURGICAL SUPPLY — 19 items
BAG URINE DRAIN 2000ML AR STRL (UROLOGICAL SUPPLIES) IMPLANT
BAG URO CATCHER STRL LF (MISCELLANEOUS) ×3 IMPLANT
CATH FOLEY 3WAY 30CC 22FR (CATHETERS) IMPLANT
CATH URET 5FR 28IN OPEN ENDED (CATHETERS) IMPLANT
GLOVE SURG SS PI 8.0 STRL IVOR (GLOVE) ×3 IMPLANT
GOWN STRL REUS W/TWL XL LVL3 (GOWN DISPOSABLE) ×3 IMPLANT
HOLDER FOLEY CATH W/STRAP (MISCELLANEOUS) ×3 IMPLANT
KIT TURNOVER KIT A (KITS) ×3 IMPLANT
LOOP CUT BIPOLAR 24F LRG (ELECTROSURGICAL) ×3 IMPLANT
MANIFOLD NEPTUNE II (INSTRUMENTS) ×3 IMPLANT
PENCIL SMOKE EVACUATOR (MISCELLANEOUS) IMPLANT
SUT ETHILON 3 0 PS 1 (SUTURE) IMPLANT
SYR 30ML LL (SYRINGE) IMPLANT
SYR TOOMEY IRRIG 70ML (MISCELLANEOUS)
SYRINGE TOOMEY IRRIG 70ML (MISCELLANEOUS) IMPLANT
TRAY CYSTO PACK (CUSTOM PROCEDURE TRAY) ×3 IMPLANT
TUBING CONNECTING 10 (TUBING) ×2 IMPLANT
TUBING CONNECTING 10' (TUBING) ×1
TUBING UROLOGY SET (TUBING) ×3 IMPLANT

## 2019-05-06 NOTE — Anesthesia Postprocedure Evaluation (Signed)
Anesthesia Post Note  Patient: Mark Cline  Procedure(s) Performed: TRANSURETHRAL RESECTION OF BLADDER TUMOR WITH GEMCITABIN (N/A Bladder)     Patient location during evaluation: PACU Anesthesia Type: General Level of consciousness: awake and alert Pain management: pain level controlled Vital Signs Assessment: post-procedure vital signs reviewed and stable Respiratory status: spontaneous breathing, nonlabored ventilation, respiratory function stable and patient connected to nasal cannula oxygen Cardiovascular status: blood pressure returned to baseline and stable Postop Assessment: no apparent nausea or vomiting Anesthetic complications: no    Last Vitals:  Vitals:   05/06/19 1245 05/06/19 1300  BP: (!) 143/76   Pulse: 66 68  Resp: 17 12  Temp:  36.5 C  SpO2: 96% 92%    Last Pain:  Vitals:   05/06/19 1245  TempSrc:   PainSc: 8                  Tiajuana Amass

## 2019-05-06 NOTE — Anesthesia Preprocedure Evaluation (Signed)
Anesthesia Evaluation  Patient identified by MRN, date of birth, ID band Patient awake    Reviewed: Allergy & Precautions, NPO status , Patient's Chart, lab work & pertinent test results  Airway Mallampati: II  TM Distance: >3 FB Neck ROM: Full    Dental  (+) Dental Advisory Given   Pulmonary COPD,  oxygen dependent, former smoker,    breath sounds clear to auscultation       Cardiovascular hypertension, Pt. on medications  Rhythm:Regular Rate:Normal     Neuro/Psych negative neurological ROS     GI/Hepatic negative GI ROS, Neg liver ROS,   Endo/Other  negative endocrine ROS  Renal/GU negative Renal ROS     Musculoskeletal  (+) Arthritis ,   Abdominal   Peds  Hematology negative hematology ROS (+)   Anesthesia Other Findings   Reproductive/Obstetrics                             Anesthesia Physical Anesthesia Plan  ASA: III  Anesthesia Plan: General   Post-op Pain Management:    Induction: Intravenous  PONV Risk Score and Plan: 2 and Dexamethasone, Ondansetron and Treatment may vary due to age or medical condition  Airway Management Planned: Oral ETT  Additional Equipment: None  Intra-op Plan:   Post-operative Plan: Extubation in OR  Informed Consent: I have reviewed the patients History and Physical, chart, labs and discussed the procedure including the risks, benefits and alternatives for the proposed anesthesia with the patient or authorized representative who has indicated his/her understanding and acceptance.     Dental advisory given  Plan Discussed with: CRNA  Anesthesia Plan Comments:         Anesthesia Quick Evaluation

## 2019-05-06 NOTE — Interval H&P Note (Signed)
History and Physical Interval Note:  05/06/2019 10:19 AM  Mark Cline  has presented today for surgery, with the diagnosis of BLADDER TUMOR.  The various methods of treatment have been discussed with the patient and family. After consideration of risks, benefits and other options for treatment, the patient has consented to  Procedure(s): TRANSURETHRAL RESECTION OF BLADDER TUMOR WITH GEMCITABIN (N/A) as a surgical intervention.  The patient's history has been reviewed, patient examined, no change in status, stable for surgery.  I have reviewed the patient's chart and labs.  Questions were answered to the patient's satisfaction.     Irine Seal

## 2019-05-06 NOTE — Transfer of Care (Signed)
Immediate Anesthesia Transfer of Care Note  Patient: Mark Cline  Procedure(s) Performed: TRANSURETHRAL RESECTION OF BLADDER TUMOR WITH GEMCITABIN (N/A Bladder)  Patient Location: PACU  Anesthesia Type:General  Level of Consciousness: awake, oriented, drowsy, patient cooperative and responds to stimulation  Airway & Oxygen Therapy: Patient Spontanous Breathing and Patient connected to face mask oxygen  Post-op Assessment: Report given to RN and Post -op Vital signs reviewed and stable  Post vital signs: Reviewed and stable  Last Vitals:  Vitals Value Taken Time  BP 123/65   Temp    Pulse 74 05/06/19 1118  Resp 15 05/06/19 1118  SpO2 100 % 05/06/19 1118  Vitals shown include unvalidated device data.  Last Pain:  Vitals:   05/06/19 0935  TempSrc: Oral  PainSc:          Complications: No apparent anesthesia complications

## 2019-05-06 NOTE — Anesthesia Procedure Notes (Signed)
Procedure Name: Intubation Date/Time: 05/06/2019 10:41 AM Performed by: Silas [place], CRNA Pre-anesthesia Checklist: Patient identified, Emergency Drugs available, Suction available and Patient being monitored Patient Re-evaluated:Patient Re-evaluated prior to induction Oxygen Delivery Method: Circle system utilized Preoxygenation: Pre-oxygenation with 100% oxygen Induction Type: IV induction Ventilation: Mask ventilation without difficulty Laryngoscope Size: Mac and 4 Grade View: Grade I Tube type: Oral Tube size: 7.5 mm Number of attempts: 1 Airway Equipment and Method: Stylet Placement Confirmation: ETT inserted through vocal cords under direct vision,  positive ETCO2 and breath sounds checked- equal and bilateral Secured at: 22 cm Tube secured with: Tape Dental Injury: Teeth and Oropharynx as per pre-operative assessment

## 2019-05-06 NOTE — Discharge Instructions (Addendum)
Indwelling Urinary Catheter Care, Adult An indwelling urinary catheter is a thin tube that is put into your bladder. The tube helps to drain pee (urine) out of your body. The tube goes in through your urethra. Your urethra is where pee comes out of your body. Your pee will come out through the catheter, then it will go into a bag (drainage bag). Take good care of your catheter so it will work well. How to wear your catheter and bag Supplies needed  Sticky tape (adhesive tape) or a leg strap.  Alcohol wipe or soap and water (if you use tape).  A clean towel (if you use tape).  Large overnight bag.  Smaller bag (leg bag). Wearing your catheter Attach your catheter to your leg with tape or a leg strap.  Make sure the catheter is not pulled tight.  If a leg strap gets wet, take it off and put on a dry strap.  If you use tape to hold the bag on your leg: 1. Use an alcohol wipe or soap and water to wash your skin where the tape made it sticky before. 2. Use a clean towel to pat-dry that skin. 3. Use new tape to make the bag stay on your leg. Wearing your bags You should have been given a large overnight bag.  You may wear the overnight bag in the day or night.  Always have the overnight bag lower than your bladder.  Do not let the bag touch the floor.  Before you go to sleep, put a clean plastic bag in a wastebasket. Then hang the overnight bag inside the wastebasket. You should also have a smaller leg bag that fits under your clothes.  Always wear the leg bag below your knee.  Do not wear your leg bag at night. How to care for your skin and catheter Supplies needed  A clean washcloth.  Water and mild soap.  A clean towel. Caring for your skin and catheter      Clean the skin around your catheter every day: 1. Wash your hands with soap and water. 2. Wet a clean washcloth in warm water and mild soap. 3. Clean the skin around your urethra.  If you are  male:  Gently spread the folds of skin around your vagina (labia).  With the washcloth in your other hand, wipe the inner side of your labia on each side. Wipe from front to back.  If you are male:  Pull back any skin that covers the end of your penis (foreskin).  With the washcloth in your other hand, wipe your penis in small circles. Start wiping at the tip of your penis, then move away from the catheter.  Move the foreskin back in place, if needed. 4. With your free hand, hold the catheter close to where it goes into your body.  Keep holding the catheter during cleaning so it does not get pulled out. 5. With the washcloth in your other hand, clean the catheter.  Only wipe downward on the catheter.  Do not wipe upward toward your body. Doing this may push germs into your urethra and cause infection. 6. Use a clean towel to pat-dry the catheter and the skin around it. Make sure to wipe off all soap. 7. Wash your hands with soap and water.  Shower every day. Do not take baths.  Do not use cream, ointment, or lotion on the area where the catheter goes into your body, unless your doctor tells you  to.  Do not use powders, sprays, or lotions on your genital area.  Check your skin around the catheter every day for signs of infection. Check for: ? Redness, swelling, or pain. ? Fluid or blood. ? Warmth. ? Pus or a bad smell. How to empty the bag Supplies needed  Rubbing alcohol.  Gauze pad or cotton ball.  Tape or a leg strap. Emptying the bag Pour the pee out of your bag when it is ?- full, or at least 2-3 times a day. Do this for your overnight bag and your leg bag. 1. Wash your hands with soap and water. 2. Separate (detach) the bag from your leg. 3. Hold the bag over the toilet or a clean pail. Keep the bag lower than your hips and bladder. This is so the pee (urine) does not go back into the tube. 4. Open the pour spout. It is at the bottom of the bag. 5. Empty the  pee into the toilet or pail. Do not let the pour spout touch any surface. 6. Put rubbing alcohol on a gauze pad or cotton ball. 7. Use the gauze pad or cotton ball to clean the pour spout. 8. Close the pour spout. 9. Attach the bag to your leg with tape or a leg strap. 10. Wash your hands with soap and water. Follow instructions for cleaning the drainage bag:  From the product maker.  As told by your doctor. How to change the bag Supplies needed  Alcohol wipes.  A clean bag.  Tape or a leg strap. Changing the bag Replace your bag when it starts to leak, smell bad, or look dirty. 1. Wash your hands with soap and water. 2. Separate the dirty bag from your leg. 3. Pinch the catheter with your fingers so that pee does not spill out. 4. Separate the catheter tube from the bag tube where these tubes connect (at the connection valve). Do not let the tubes touch any surface. 5. Clean the end of the catheter tube with an alcohol wipe. Use a different alcohol wipe to clean the end of the bag tube. 6. Connect the catheter tube to the tube of the clean bag. 7. Attach the clean bag to your leg with tape or a leg strap. Do not make the bag tight on your leg. 8. Wash your hands with soap and water. General rules   Never pull on your catheter. Never try to take it out. Doing that can hurt you.  Always wash your hands before and after you touch your catheter or bag. Use a mild, fragrance-free soap. If you do not have soap and water, use hand sanitizer.  Always make sure there are no twists or bends (kinks) in the catheter tube.  Always make sure there are no leaks in the catheter or bag.  Drink enough fluid to keep your pee pale yellow.  Do not take baths, swim, or use a hot tub.  If you are male, wipe from front to back after you poop (have a bowel movement). Contact a doctor if:  Your pee is cloudy.  Your pee smells worse than usual.  Your catheter gets clogged.  Your catheter  leaks.  Your bladder feels full. Get help right away if:  You have redness, swelling, or pain where the catheter goes into your body.  You have fluid, blood, pus, or a bad smell coming from the area where the catheter goes into your body.  Your skin feels warm where  the catheter goes into your body.  You have a fever.  You have pain in your: ? Belly (abdomen). ? Legs. ? Lower back. ? Bladder.  You see blood in the catheter.  Your pee is pink or red.  You feel sick to your stomach (nauseous).  You throw up (vomit).  You have chills.  Your pee is not draining into the bag.  Your catheter gets pulled out. Summary  An indwelling urinary catheter is a thin tube that is placed into the bladder to help drain pee (urine) out of the body.  The catheter is placed into the part of the body that drains pee from the bladder (urethra).  Taking good care of your catheter will keep it working properly and help prevent problems.  Always wash your hands before and after touching your catheter or bag.  Never pull on your catheter or try to take it out. This information is not intended to replace advice given to you by your health care provider. Make sure you discuss any questions you have with your health care provider. Document Revised: 04/24/2018 Document Reviewed: 08/16/2016 Elsevier Patient Education  [place].  DISCONTINUE URINARY CATHETER IN THE MORNING 05/07/2019 BY CUTTING THE PIGTAIL FROM THE CATHETER. ABOUT TWO TEASPOONS OF WATER SHOULD COME OUT, THEN PULL THE CATHETER OUT. IF YOU HAVE ANY PROBLEMS OR QUESTIONS PLEASE CALL Dr. Ralene Muskrat office.  Transurethral Resection of Bladder Tumor, Care After This sheet gives you information about how to care for yourself after your procedure. Your health care provider may also give you more specific instructions. If you have problems or questions, contact your health care provider. What can I expect after the procedure? After  the procedure, it is common to have:  A small amount of blood in your urine for up to 2 weeks.  Soreness or mild pain from your catheter. After your catheter is removed, you may have mild soreness, especially when urinating.  Pain in your lower abdomen. Follow these instructions at home: Medicines   Take over-the-counter and prescription medicines only as told by your health care provider.  If you were prescribed an antibiotic medicine, take it as told by your health care provider. Do not stop taking the antibiotic even if you start to feel better.  Do not drive for 24 hours if you were given a sedative during your procedure.  Ask your health care provider if the medicine prescribed to you: ? Requires you to avoid driving or using heavy machinery. ? Can cause constipation. You may need to take these actions to prevent or treat constipation:  Take over-the-counter or prescription medicines.  Eat foods that are high in fiber, such as beans, whole grains, and fresh fruits and vegetables.  Limit foods that are high in fat and processed sugars, such as fried or sweet foods. Activity  Return to your normal activities as told by your health care provider. Ask your health care provider what activities are safe for you.  Do not lift anything that is heavier than 10 lb (4.5 kg), or the limit that you are told, until your health care provider says that it is safe.  Avoid intense physical activity for as long as told by your health care provider.  Rest as told by your health care provider.  Avoid sitting for a long time without moving. Get up to take short walks every 1-2 hours. This is important to improve blood flow and breathing. Ask for help if you feel weak or  unsteady. General instructions   Do not drink alcohol for as long as told by your health care provider. This is especially important if you are taking prescription pain medicines.  Do not take baths, swim, or use a hot tub  until your health care provider approves. Ask your health care provider if you may take showers. You may only be allowed to take sponge baths.  If you have a catheter, follow instructions from your health care provider about caring for your catheter and your drainage bag.  Drink enough fluid to keep your urine pale yellow.  Wear compression stockings as told by your health care provider. These stockings help to prevent blood clots and reduce swelling in your legs.  Keep all follow-up visits as told by your health care provider. This is important. ? You will need to be followed closely with regular checks of your bladder and urethra (cystoscopies) to make sure that the cancer does not come back. Contact a health care provider if:  You have pain that gets worse or does not improve with medicine.  You have blood in your urine for more than 2 weeks.  You have cloudy or bad-smelling urine.  You become constipated. Signs of constipation may include having: ? Fewer than three bowel movements in a week. ? Difficulty having a bowel movement. ? Stools that are dry, hard, or larger than normal.  You have a fever. Get help right away if:  You have: ? Severe pain. ? Bright red blood in your urine. ? Blood clots in your urine. ? A lot of blood in your urine.  Your catheter has been removed and you are not able to urinate.  You have a catheter in place and the catheter is not draining urine. Summary  After your procedure, it is common to have a small amount of blood in your urine, soreness or mild pain from your catheter, and pain in your lower abdomen.  Take over-the-counter and prescription medicines only as told by your health care provider.  Rest as told by your health care provider. Follow your health care provider's instructions about returning to normal activities. Ask what activities are safe for you.  If you have a catheter, follow instructions from your health care provider  about caring for your catheter and your drainage bag.  Get help right away if you cannot urinate, you have severe pain, or you have bright red blood or blood clots in your urine. This information is not intended to replace advice given to you by your health care provider. Make sure you discuss any questions you have with your health care provider.  You may remove the catheter in the morning by cutting off the side arm and letting the balloon drain. If you don't feel comfortable doing this, please call the office to have it removed.   Document Revised: 07/31/2017 Document Reviewed: 07/31/2017 Elsevier Patient Education  [place].

## 2019-05-06 NOTE — Op Note (Signed)
Procedure: 1.  Cystoscopy with urethral dilation. 2.  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 3.  Instillation of gemcitabine in PACU.  Preop diagnosis: Bladder lesion at right bladder neck.  Postop diagnosis: Same with findings suspicious for neoplasm.  Surgeon: Dr. Irine Seal.  Anesthesia: General.  Drain: 60 French Foley catheter.  Specimen: Bladder tumor chips.  EBL: None.  Complications: None.  Indications: The patient is a 74 year old male with a history of prostate cancer treated with radiation therapy who was evaluated for hematuria and found to have a lesion at the right bladder neck.  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  Procedure: He was taken the operating room where he was given 2 g of Ancef.  A general anesthetic was induced.  He was placed in lithotomy position and fitted with PAS hose.  He was prepped with Betadine solution and draped in usual sterile fashion.  Cystoscopy was performed using a 23 [place] scope and 30 degree lens.  Examination revealed a normal urethra with exception of a mild bulbar stricture that allowed passage of the scope.  The external sphincter was intact the prostatic urethra had bilobar hyperplasia with mild coaptation and blanching of the mucosa consistent with radiation changes.  Examination of bladder revealed mild trabeculation there were a few areas of radiation telangiectasia on the posterior wall and trigone but on the left bladder neck there was an area approximately 3 to 4 cm with cobblestoning mucosa that was worrisome for carcinoma in situ.  There were some vascular changes that were close to but not on the left ureteral orifice that appeared more consistent with radiation changes.  No other tumors  were identified no stones were seen.  The cystoscope was removed and the urethra was then dilated with Leander Rams sounds to 29 [place].  The 42 French continuous-flow resectoscope sheath with the visual obturator was then passed without difficulty into the bladder.  The visual obturator was exchanged for an [place] handle with a bipolar loop and 30 degree lens.  Saline was used the irrigant.  The lesion at the left bladder neck was then resected out to the muscular fibers and the surrounding neovascularity close to the orifice was managed with fulguration.  Once the resection was complete and hemostasis was achieved, inspection revealed no evidence of bladder wall perforation.  The bladder was evacuated free of the specimen and the scope was removed.  A 20 French Foley catheter was inserted and the balloon was filled with 10 mL of sterile fluid.  The cath was placed to straight drainage.  The specimen was retrieved and collected for pathologic analysis.  He was taken down from lithotomy position, his anesthetic was reversed and he was moved recovery in stable condition.  In the recovery room once available gemcitabine 2000 mg and 50 mL of diluent was instilled into the bladder and left indwelling for 1 hour before catheter drainage.  There were no complications.

## 2019-05-07 LAB — SURGICAL PATHOLOGY

## 2019-05-13 DIAGNOSIS — J449 Chronic obstructive pulmonary disease, unspecified: Secondary | ICD-10-CM | POA: Diagnosis not present

## 2019-05-13 DIAGNOSIS — I10 Essential (primary) hypertension: Secondary | ICD-10-CM | POA: Diagnosis not present

## 2019-05-13 DIAGNOSIS — E7800 Pure hypercholesterolemia, unspecified: Secondary | ICD-10-CM | POA: Diagnosis not present

## 2019-05-20 DIAGNOSIS — I714 Abdominal aortic aneurysm, without rupture: Secondary | ICD-10-CM | POA: Diagnosis not present

## 2019-05-20 DIAGNOSIS — C670 Malignant neoplasm of trigone of bladder: Secondary | ICD-10-CM | POA: Diagnosis not present

## 2019-05-20 DIAGNOSIS — N281 Cyst of kidney, acquired: Secondary | ICD-10-CM | POA: Diagnosis not present

## 2019-05-20 DIAGNOSIS — R350 Frequency of micturition: Secondary | ICD-10-CM | POA: Diagnosis not present

## 2019-05-20 DIAGNOSIS — Z8551 Personal history of malignant neoplasm of bladder: Secondary | ICD-10-CM | POA: Diagnosis not present

## 2019-06-01 ENCOUNTER — Ambulatory Visit: Payer: Medicare Other | Admitting: Pulmonary Disease

## 2019-06-01 ENCOUNTER — Encounter: Payer: Self-pay | Admitting: Pulmonary Disease

## 2019-06-01 ENCOUNTER — Other Ambulatory Visit: Payer: Self-pay

## 2019-06-01 VITALS — BP 118/72 | HR 88 | Temp 98.6°F | Ht 70.0 in | Wt 182.6 lb

## 2019-06-01 DIAGNOSIS — J9611 Chronic respiratory failure with hypoxia: Secondary | ICD-10-CM | POA: Diagnosis not present

## 2019-06-01 DIAGNOSIS — J432 Centrilobular emphysema: Secondary | ICD-10-CM

## 2019-06-01 NOTE — Progress Notes (Signed)
[facility]  Chief Complaint  Patient presents with  . Follow-up    1 month f/u for COPD. Was able to a successful surgery. Breathing has been stable since last visit.     Constitutional:  BP 118/72   Pulse 88   Temp 98.6 F (37 C) (Temporal)   Ht 5\' 10"  (1.778 m)   Wt 182 lb 9.6 oz (82.8 kg)   SpO2 96% Comment: on 3L pulse  BMI 26.20 kg/m   Past Medical History:  HTN, OA, AAA, Prostate cancer  Brief Summary:  Mark Cline is a 74 y.o. male former smoker with COPD/emphysema and chronic hypoxic respiratory failure.  Subjective:   He had cystoscopy with resection of 3 cm Lt bladder neck tumor in April with Dr. Jeffie Pollock.  Breathing has been okay at rest.  Not very active.  Denies cough, sputum, fever, chest pain, hemoptysis, wheezing.  Borrowed a friend's POC to travel to [place].  This seemed to work well for him.  He maintained SpO2 > 90% on 3 liters pulsed O2 with POC today.  Physical Exam:   Appearance - well kempt, wearing oxygen  ENMT - no sinus tenderness, no oral exudate, no LAN, Mallampati 2 airway, no stridor  Respiratory - equal breath sounds bilaterally, no wheezing or rales  CV - s1s2 regular rate and rhythm, no murmurs  Ext - no clubbing, no edema  Skin - no rashes  Psych - normal mood and affect  Assessment/Plan:   COPD with emphysema and chronic bronchitis. - continue trelegy and prn albuterol - encouraged him to maintain his activity level at home - will plan to re-enroll him in pulmonary rehab after he is further along in therapy for bladder cancer  Chronic respiratory failure with hypoxia. - he did well with 3 liters pulsed oxygen using POC today - will send order to arrange for POC at 3 liters pulsed - goal SpO2 > 90%  A total of  33 minutes spent addressing patient care issues on day of visit.  Follow up:   Patient Instructions  Will arrange for portable oxygen concentrator  Follow up in 4  months   Signature:  [name], MD [place] Pager: [phone] 06/01/2019, 12:30 PM  Flow Sheet     Pulmonary tests:  A1AT 09/10/18 >> 139, MM  ONO with RA 09/14/18 >> test time 13 hrs 58 min.  Baseline SpO2 91%, low SpO2 77%.  Spent 2 hrs 7 min with SpO2 < 88%.  Chest imaging:  CT chest 07/15/17 >> atherosclerosis, calcified Rt hilar/paratracheal/subcarinal LN, advanced changes of emphysema, bronchial wall thickening, 1.5 cm density RUL, calcified granuloma RML and liver/spleen CT chest 12/09/17 >> stable RUL density, new patchy ASD lower lobes Lt > Rt  Medications:   Allergies as of 06/01/2019   No Known Allergies     Medication List       Accurate as of Jun 01, 2019 12:30 PM. If you have any questions, ask your nurse or doctor.        lisinopril 10 MG tablet Commonly known as: ZESTRIL Take 10 mg by mouth daily.   Trelegy Ellipta 100-62.5-25 MCG/INH Aepb Generic drug: Fluticasone-Umeclidin-Vilant Inhale 1 puff into the lungs daily.       Past Surgical History:  He  has a past surgical history that includes Prostate biopsy (02-28-2017   dr Jeffie Pollock office); Appendectomy (1960s); Tonsillectomy (age 89); Laparoscopic cholecystectomy (1992); Radioactive seed implant (N/A, 07/31/2017); SPACE OAR  INSTILLATION (N/A, 07/31/2017); and Transurethral resection of bladder tumor with mitomycin-c (N/A, 05/06/2019).  Family History:  His family history includes Cancer (age of onset: 70) in his father.  Social History:  He  reports that he quit smoking about 2 years ago. His smoking use included cigarettes. He has a 30.00 pack-year smoking history. He has never used smokeless tobacco. He reports that he does not drink alcohol or use drugs.

## 2019-06-01 NOTE — Patient Instructions (Addendum)
Will arrange for portable oxygen concentrator  Follow up in 4 months

## 2019-06-05 DIAGNOSIS — J449 Chronic obstructive pulmonary disease, unspecified: Secondary | ICD-10-CM | POA: Diagnosis not present

## 2019-06-11 ENCOUNTER — Other Ambulatory Visit: Payer: Self-pay | Admitting: Urology

## 2019-06-12 LAB — PSA: PSA: <0.1 ng/mL (ref <=4.0)

## 2019-06-15 NOTE — Progress Notes (Signed)
Letter mailed

## 2019-06-16 DIAGNOSIS — Z5111 Encounter for antineoplastic chemotherapy: Secondary | ICD-10-CM | POA: Diagnosis not present

## 2019-06-16 DIAGNOSIS — C670 Malignant neoplasm of trigone of bladder: Secondary | ICD-10-CM | POA: Diagnosis not present

## 2019-06-18 ENCOUNTER — Other Ambulatory Visit: Payer: Self-pay

## 2019-06-18 ENCOUNTER — Encounter: Payer: Self-pay | Admitting: Urology

## 2019-06-18 ENCOUNTER — Ambulatory Visit: Payer: Medicare Other | Admitting: Urology

## 2019-06-18 VITALS — BP 120/75 | HR 87 | Temp 98.1°F | Ht 70.0 in | Wt 181.0 lb

## 2019-06-18 DIAGNOSIS — R3915 Urgency of urination: Secondary | ICD-10-CM

## 2019-06-18 DIAGNOSIS — E291 Testicular hypofunction: Secondary | ICD-10-CM | POA: Diagnosis not present

## 2019-06-18 DIAGNOSIS — C678 Malignant neoplasm of overlapping sites of bladder: Secondary | ICD-10-CM

## 2019-06-18 DIAGNOSIS — Z8546 Personal history of malignant neoplasm of prostate: Secondary | ICD-10-CM

## 2019-06-18 LAB — POCT URINALYSIS DIPSTICK
Bilirubin, UA: NEGATIVE
Glucose, UA: NEGATIVE
Ketones, UA: NEGATIVE
Nitrite, UA: NEGATIVE
Protein, UA: POSITIVE — AB
Spec Grav, UA: 1.015 (ref 1.010–1.025)
Urobilinogen, UA: 0.2 E.U./dL
pH, UA: 6 (ref 5.0–8.0)

## 2019-06-18 NOTE — Progress Notes (Signed)
Subjective:  1. History of prostate cancer   2. Malignant neoplasm of overlapping sites of bladder ([place])   3. Hypogonadism in male   4. Urgency of urination     Mark Cline returns today in f/u. For his history of bladder cancer.  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  He has improvement in his urgency.  His UA has 2+ blood and 1+ LE.   He has a history of prostate cancer with a seed implant on 07/31/17 and he completed EXRT in 9/19.  He received ADT for 2 years.   He had Gleason 9 disease.  His PSA was <0.1 on 06/11/19.  His last testosterone level was castrate in 11/20.  His IPSS is 8.      ROS:  ROS:  A complete review of systems was performed.  All systems are negative except for pertinent findings as noted.   ROS  No Known Allergies  Outpatient Encounter Medications as of 06/18/2019  Medication Sig  . Fluticasone-Umeclidin-Vilant (TRELEGY ELLIPTA) 100-62.5-25 MCG/INH AEPB Inhale 1 puff into the lungs daily.  Marland Kitchen lisinopril (PRINIVIL,ZESTRIL) 10 MG tablet Take 10 mg by mouth daily.    No facility-administered encounter medications on file as of 06/18/2019.    Past Medical History:  Diagnosis Date  . AAA (abdominal aortic aneurysm) ([place]) followed by pcp   infarenal , per CT 03-17-2017 , 4.4cm  . Arthritis    hands  . COPD with emphysema ([place])    followed by pcp. 2LO2  . Dyspnea on exertion   . History of amputation of finger age 33  . Hypertension   . Prostate cancer [facility] [place]) urologist-  dr Tyrez Berrios/  onologist-- dr Tammi Klippel   dx 02-28-2017-- STage T2b, Gleason 4+5, PSA 8.8-- scheduled for radiactive seed implants 07-31-2017 then IMRT  . Wears glasses   . Wears partial dentures    upper    Past Surgical History:  Procedure Laterality Date  . APPENDECTOMY  1960s  . LAPAROSCOPIC CHOLECYSTECTOMY  1992  . PROSTATE BIOPSY  02-28-2017   dr Jeffie Pollock office  . RADIOACTIVE  SEED IMPLANT N/A 07/31/2017   Procedure: RADIOACTIVE SEED IMPLANT/BRACHYTHERAPY IMPLANT;  Surgeon: Irine Seal, MD;  Location: [facility];  Service: Urology;  Laterality: N/A;  . SPACE OAR INSTILLATION N/A 07/31/2017   Procedure: SPACE OAR INSTILLATION;  Surgeon: Irine Seal, MD;  Location: [facility];  Service: Urology;  Laterality: N/A;  . TONSILLECTOMY  age 48  . TRANSURETHRAL RESECTION OF BLADDER TUMOR WITH MITOMYCIN-C N/A 05/06/2019   Procedure: TRANSURETHRAL RESECTION OF BLADDER TUMOR WITH GEMCITABIN;  Surgeon: Irine Seal, MD;  Location: [facility];  Service: Urology;  Laterality: N/A;    Social History   Socioeconomic History  . Marital status: Married    Spouse name: Luismario Coston  . Number of children: 4  . Years of education: Not on file  . Highest education level: Not on file  Occupational History  . Occupation: Truck Geophysicist/field seismologist    Comment: truck Geophysicist/field seismologist  Tobacco Use  . Smoking status: Former Smoker    Packs/day: 0.50    Years: 60.00    Pack years: 30.00    Types: Cigarettes    Quit date: 07/28/2016    Years since quitting: 2.8  . Smokeless tobacco: Never Used  Substance and Sexual Activity  . Alcohol use: No  . Drug use:  No  . Sexual activity: Not on file  Other Topics Concern  . Not on file  Social History Narrative  . Not on file   Social Determinants of Health   Financial Resource Strain:   . Difficulty of Paying Living Expenses:   Food Insecurity:   . Worried About Charity fundraiser in the Last Year:   . Arboriculturist in the Last Year:   Transportation Needs:   . Film/video editor (Medical):   Marland Kitchen Lack of Transportation (Non-Medical):   Physical Activity:   . Days of Exercise per Week:   . Minutes of Exercise per Session:   Stress:   . Feeling of Stress :   Social Connections:   . Frequency of Communication with Friends and Family:   . Frequency of Social Gatherings with Friends and Family:   . Attends Religious  Services:   . Active Member of Clubs or Organizations:   . Attends Archivist Meetings:   Marland Kitchen Marital Status:   Intimate Partner Violence:   . Fear of Current or Ex-Partner:   . Emotionally Abused:   Marland Kitchen Physically Abused:   . Sexually Abused:     Family History  Problem Relation Age of Onset  . Cancer Father 74       lung       Objective: Vitals:   06/18/19 1029  BP: 120/75  Pulse: 87  Temp: 98.1 F (36.7 C)     Physical Exam  Lab Results:  Results for orders placed or performed in visit on 06/18/19 (from the past 24 hour(s))  POCT urinalysis dipstick     Status: Abnormal   Collection Time: 06/18/19 10:35 AM  Result Value Ref Range   Color, UA yellow    Clarity, UA     Glucose, UA Negative Negative   Bilirubin, UA neg    Ketones, UA neg    Spec Grav, UA 1.015 1.010 - 1.025   Blood, UA ++    pH, UA 6.0 5.0 - 8.0   Protein, UA Positive (A) Negative   Urobilinogen, UA 0.2 0.2 or 1.0 E.U./dL   Nitrite, UA neg    Leukocytes, UA Small (1+) (A) Negative   Appearance clear    Odor      BMET No results for input(s): NA, K, CL, CO2, GLUCOSE, BUN, CREATININE, CALCIUM in the last 72 hours. PSA PSA  Date Value Ref Range Status  06/11/2019 <0.1 < OR = 4.0 ng/mL Final    Comment:    The total PSA value from this assay system is  standardized against the WHO standard. The test  result will be approximately 20% lower when compared  to the equimolar-standardized total PSA (Beckman  Coulter). Comparison of serial PSA results should be  interpreted with this fact in mind. . This test was performed using the Siemens  chemiluminescent method. Values obtained from  different assay methods cannot be used interchangeably. PSA levels, regardless of value, should not be interpreted as absolute evidence of the presence or absence of disease.    No results found for: TESTOSTERONE    Studies/Results: No results found.    Assessment & Plan: History of  prostate cancer.  His PSA remains undetectible but we don't have a recent T to reassess the ADT associate hypogonadism.  I will repeat a PSA and testosterone in 6 months.  History of CIS.  He will complete the BCG induction in [place] and then return in 3 months  here for cystoscopy.    No orders of the defined types were placed in this encounter.    Orders Placed This Encounter  Procedures  . PSA    Standing Status:   Future    Standing Expiration Date:   06/17/2020  . Testosterone    Standing Status:   Future    Standing Expiration Date:   06/17/2020  . POCT urinalysis dipstick      Return in about 3 months (around 09/18/2019) for for cystoscopy.   he will need cystoscopy and a PSA and testosterone in 6 months. .   CC: Medicine, [facility] Internal      Irine Seal 06/18/2019

## 2019-06-18 NOTE — Progress Notes (Signed)
Urological Symptom Review  Patient is experiencing the following symptoms: Hard to postpone urination Get up at night to urinate   Review of Systems  Gastrointestinal (upper)  : Negative for upper GI symptoms  Gastrointestinal (lower) : Negative for lower GI symptoms  Constitutional : Negative for symptoms  Skin: Negative for skin symptoms  Eyes: Negative for eye symptoms  Ear/Nose/Throat : Negative for Ear/Nose/Throat symptoms  Hematologic/Lymphatic: Negative for Hematologic/Lymphatic symptoms  Cardiovascular : Negative for cardiovascular symptoms  Respiratory : Shortness of breath  Endocrine: Negative for endocrine symptoms  Musculoskeletal: Negative for musculoskeletal symptoms  Neurological: Negative for neurological symptoms  Psychologic: Negative for psychiatric symptoms

## 2019-06-23 DIAGNOSIS — Z5111 Encounter for antineoplastic chemotherapy: Secondary | ICD-10-CM | POA: Diagnosis not present

## 2019-06-23 DIAGNOSIS — C670 Malignant neoplasm of trigone of bladder: Secondary | ICD-10-CM | POA: Diagnosis not present

## 2019-06-28 ENCOUNTER — Emergency Department (HOSPITAL_COMMUNITY): Payer: No Typology Code available for payment source

## 2019-06-28 ENCOUNTER — Other Ambulatory Visit: Payer: Self-pay

## 2019-06-28 ENCOUNTER — Encounter (HOSPITAL_COMMUNITY): Payer: Self-pay

## 2019-06-28 ENCOUNTER — Emergency Department (HOSPITAL_COMMUNITY)
Admission: EM | Admit: 2019-06-28 | Discharge: 2019-06-28 | Disposition: A | Discharge status: Home / Self Care | Payer: No Typology Code available for payment source | Attending: Emergency Medicine | Admitting: Emergency Medicine

## 2019-06-28 DIAGNOSIS — Y92219 Unspecified school as the place of occurrence of the external cause: Secondary | ICD-10-CM | POA: Diagnosis not present

## 2019-06-28 DIAGNOSIS — W1839XA Other fall on same level, initial encounter: Secondary | ICD-10-CM | POA: Insufficient documentation

## 2019-06-28 DIAGNOSIS — Y999 Unspecified external cause status: Secondary | ICD-10-CM | POA: Insufficient documentation

## 2019-06-28 DIAGNOSIS — Z87891 Personal history of nicotine dependence: Secondary | ICD-10-CM | POA: Diagnosis not present

## 2019-06-28 DIAGNOSIS — M79652 Pain in left thigh: Secondary | ICD-10-CM | POA: Insufficient documentation

## 2019-06-28 DIAGNOSIS — M79605 Pain in left leg: Secondary | ICD-10-CM | POA: Diagnosis not present

## 2019-06-28 DIAGNOSIS — Y9389 Activity, other specified: Secondary | ICD-10-CM | POA: Insufficient documentation

## 2019-06-28 DIAGNOSIS — J449 Chronic obstructive pulmonary disease, unspecified: Secondary | ICD-10-CM | POA: Insufficient documentation

## 2019-06-28 DIAGNOSIS — J9611 Chronic respiratory failure with hypoxia: Secondary | ICD-10-CM | POA: Diagnosis not present

## 2019-06-28 DIAGNOSIS — M79606 Pain in leg, unspecified: Secondary | ICD-10-CM

## 2019-06-28 DIAGNOSIS — S79922A Unspecified injury of left thigh, initial encounter: Secondary | ICD-10-CM | POA: Diagnosis not present

## 2019-06-28 MED ORDER — TRAMADOL HCL 50 MG PO TABS: 50.0000 mg | ORAL_TABLET | Freq: Four times a day (QID) | ORAL | 0 refills | Status: DC | PRN
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
## 2019-06-30 DIAGNOSIS — Z5111 Encounter for antineoplastic chemotherapy: Secondary | ICD-10-CM | POA: Diagnosis not present

## 2019-06-30 DIAGNOSIS — C670 Malignant neoplasm of trigone of bladder: Secondary | ICD-10-CM | POA: Diagnosis not present
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
## 2019-07-06 DIAGNOSIS — J449 Chronic obstructive pulmonary disease, unspecified: Secondary | ICD-10-CM | POA: Diagnosis not present

## 2019-07-07 DIAGNOSIS — Z5111 Encounter for antineoplastic chemotherapy: Secondary | ICD-10-CM | POA: Diagnosis not present

## 2019-07-07 DIAGNOSIS — C670 Malignant neoplasm of trigone of bladder: Secondary | ICD-10-CM | POA: Diagnosis not present

## 2019-07-14 DIAGNOSIS — E7800 Pure hypercholesterolemia, unspecified: Secondary | ICD-10-CM | POA: Diagnosis not present

## 2019-07-14 DIAGNOSIS — J449 Chronic obstructive pulmonary disease, unspecified: Secondary | ICD-10-CM | POA: Diagnosis not present

## 2019-07-14 DIAGNOSIS — Z5111 Encounter for antineoplastic chemotherapy: Secondary | ICD-10-CM | POA: Diagnosis not present

## 2019-07-14 DIAGNOSIS — I10 Essential (primary) hypertension: Secondary | ICD-10-CM | POA: Diagnosis not present

## 2019-07-14 DIAGNOSIS — C670 Malignant neoplasm of trigone of bladder: Secondary | ICD-10-CM | POA: Diagnosis not present

## 2019-07-21 DIAGNOSIS — Z5111 Encounter for antineoplastic chemotherapy: Secondary | ICD-10-CM | POA: Diagnosis not present

## 2019-07-21 DIAGNOSIS — C670 Malignant neoplasm of trigone of bladder: Secondary | ICD-10-CM | POA: Diagnosis not present

## 2019-08-05 DIAGNOSIS — J449 Chronic obstructive pulmonary disease, unspecified: Secondary | ICD-10-CM | POA: Diagnosis not present

## 2019-08-13 DIAGNOSIS — J449 Chronic obstructive pulmonary disease, unspecified: Secondary | ICD-10-CM | POA: Diagnosis not present

## 2019-08-13 DIAGNOSIS — E7800 Pure hypercholesterolemia, unspecified: Secondary | ICD-10-CM | POA: Diagnosis not present

## 2019-08-13 DIAGNOSIS — I10 Essential (primary) hypertension: Secondary | ICD-10-CM | POA: Diagnosis not present

## 2019-08-18 ENCOUNTER — Telehealth: Payer: Self-pay

## 2019-08-18 ENCOUNTER — Other Ambulatory Visit: Payer: Self-pay

## 2019-08-18 DIAGNOSIS — Z8546 Personal history of malignant neoplasm of prostate: Secondary | ICD-10-CM

## 2019-08-18 DIAGNOSIS — C670 Malignant neoplasm of trigone of bladder: Secondary | ICD-10-CM | POA: Diagnosis not present

## 2019-08-18 DIAGNOSIS — C61 Malignant neoplasm of prostate: Secondary | ICD-10-CM

## 2019-08-18 DIAGNOSIS — N3000 Acute cystitis without hematuria: Secondary | ICD-10-CM | POA: Diagnosis not present

## 2019-08-18 NOTE — Telephone Encounter (Signed)
-----   Message from Irine Seal, MD sent at 08/18/2019  3:51 PM EDT ----- Mark Cline was in [place] for a cysto appointment today but he had a UTI.  He has f/u in early September in [place] for his prostate cancer but will need to have cystoscopy done at that visit.   He will need his PSA order for 9/3 changed to [facility] too.

## 2019-08-18 NOTE — Telephone Encounter (Signed)
Pt called and made aware

## 2019-09-04 DIAGNOSIS — J9611 Chronic respiratory failure with hypoxia: Secondary | ICD-10-CM | POA: Diagnosis not present

## 2019-09-04 DIAGNOSIS — J069 Acute upper respiratory infection, unspecified: Secondary | ICD-10-CM | POA: Diagnosis not present

## 2019-09-04 DIAGNOSIS — Z20822 Contact with and (suspected) exposure to covid-19: Secondary | ICD-10-CM | POA: Diagnosis not present

## 2019-09-04 DIAGNOSIS — J449 Chronic obstructive pulmonary disease, unspecified: Secondary | ICD-10-CM | POA: Diagnosis not present

## 2019-09-05 DIAGNOSIS — J449 Chronic obstructive pulmonary disease, unspecified: Secondary | ICD-10-CM | POA: Diagnosis not present

## 2019-09-06 DIAGNOSIS — Z20822 Contact with and (suspected) exposure to covid-19: Secondary | ICD-10-CM | POA: Diagnosis not present

## 2019-09-09 ENCOUNTER — Other Ambulatory Visit: Payer: Self-pay

## 2019-09-09 ENCOUNTER — Other Ambulatory Visit: Payer: Medicare Other

## 2019-09-09 DIAGNOSIS — C61 Malignant neoplasm of prostate: Secondary | ICD-10-CM

## 2019-09-10 LAB — PSA: Prostate Specific Ag, Serum: <0.1 ng/mL (ref 0.0–4.0)

## 2019-09-10 LAB — TESTOSTERONE: Testosterone: 5 ng/dL — ABNORMAL LOW (ref 264–916)

## 2019-09-16 NOTE — Progress Notes (Signed)
Subjective:  1. History of bladder cancer   2. History of prostate cancer   3. Hypogonadism in male     [name] returns today in f/u. For his history of bladder cancer.  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  He is for surveillance cystoscopy today.  He is voiding ok with reduced nocturia which is down to 1 x.   He has had no hematuria or dysuria.  His UA is unremarkable today.   He has a history of prostate cancer with a seed implant on 07/31/17 and he completed EXRT in 9/19.  He received ADT for 2 years.   He had Gleason 9 disease.  His PSA was <0.1 on 09/09/19.  His last testosterone level was castrate on 09/09/19.  His IPSS is 8.      ROS:  ROS:  A complete review of systems was performed.  All systems are negative except for pertinent findings as noted.   ROS  No Known Allergies  Outpatient Encounter Medications as of 09/17/2019  Medication Sig  . Fluticasone-Salmeterol (ADVAIR) 250-50 MCG/DOSE AEPB SMARTSIG:By Mouth  . Fluticasone-Umeclidin-Vilant (TRELEGY ELLIPTA) 100-62.5-25 MCG/INH AEPB Inhale 1 puff into the lungs daily.  Marland Kitchen lisinopril (PRINIVIL,ZESTRIL) 10 MG tablet Take 10 mg by mouth daily.   . traMADol (ULTRAM) 50 MG tablet Take 1 tablet (50 mg total) by mouth every 6 (six) hours as needed.  . [EXPIRED] ciprofloxacin (CIPRO) tablet 500 mg    No facility-administered encounter medications on file as of 09/17/2019.    Past Medical History:  Diagnosis Date  . AAA (abdominal aortic aneurysm) ([place]) followed by pcp   infarenal , per CT 03-17-2017 , 4.4cm  . Arthritis    hands  . COPD with emphysema ([place])    followed by pcp. 2LO2  . Dyspnea on exertion   . History of amputation of finger age 22  . Hypertension   . Prostate cancer [facility]) urologist-  dr Loida Calamia/  onologist-- dr Tammi Klippel   dx 02-28-2017-- STage T2b, Gleason 4+5, PSA 8.8--  scheduled for radiactive seed implants 07-31-2017 then IMRT  . Wears glasses   . Wears partial dentures    upper    Past Surgical History:  Procedure Laterality Date  . APPENDECTOMY  1960s  . LAPAROSCOPIC CHOLECYSTECTOMY  1992  . PROSTATE BIOPSY  02-28-2017   dr Jeffie Pollock office  . RADIOACTIVE SEED IMPLANT N/A 07/31/2017   Procedure: RADIOACTIVE SEED IMPLANT/BRACHYTHERAPY IMPLANT;  Surgeon: Irine Seal, MD;  Location: [facility];  Service: Urology;  Laterality: N/A;  . SPACE OAR INSTILLATION N/A 07/31/2017   Procedure: SPACE OAR INSTILLATION;  Surgeon: Irine Seal, MD;  Location: [facility];  Service: Urology;  Laterality: N/A;  . TONSILLECTOMY  age 47  . TRANSURETHRAL RESECTION OF BLADDER TUMOR WITH MITOMYCIN-C N/A 05/06/2019   Procedure: TRANSURETHRAL RESECTION OF BLADDER TUMOR WITH GEMCITABIN;  Surgeon: Irine Seal, MD;  Location: [facility];  Service: Urology;  Laterality: N/A;    Social History   Socioeconomic History  . Marital status: Married    Spouse name: Mark Cline  . Number of children: 4  . Years of education: Not on file  . Highest education level: Not on file  Occupational History  . Occupation: Truck Geophysicist/field seismologist    Comment: truck Geophysicist/field seismologist  Tobacco Use  . Smoking status: Former Smoker  Packs/day: 0.50    Years: 60.00    Pack years: 30.00    Types: Cigarettes    Quit date: 07/28/2016    Years since quitting: 3.1  . Smokeless tobacco: Never Used  Vaping Use  . Vaping Use: Former  Substance and Sexual Activity  . Alcohol use: No  . Drug use: No  . Sexual activity: Not on file  Other Topics Concern  . Not on file  Social History Narrative  . Not on file   Social Determinants of Health   Financial Resource Strain:   . Difficulty of Paying Living Expenses: Not on file  Food Insecurity:   . Worried About Charity fundraiser in the Last Year: Not on file  . Ran Out of Food in the Last Year: Not on file  Transportation Needs:   .  Lack of Transportation (Medical): Not on file  . Lack of Transportation (Non-Medical): Not on file  Physical Activity:   . Days of Exercise per Week: Not on file  . Minutes of Exercise per Session: Not on file  Stress:   . Feeling of Stress : Not on file  Social Connections:   . Frequency of Communication with Friends and Family: Not on file  . Frequency of Social Gatherings with Friends and Family: Not on file  . Attends Religious Services: Not on file  . Active Member of Clubs or Organizations: Not on file  . Attends Archivist Meetings: Not on file  . Marital Status: Not on file  Intimate Partner Violence:   . Fear of Current or Ex-Partner: Not on file  . Emotionally Abused: Not on file  . Physically Abused: Not on file  . Sexually Abused: Not on file    Family History  Problem Relation Age of Onset  . Cancer Father 38       lung       Objective: Vitals:   09/17/19 0940  BP: (!) 143/75  Pulse: (!) 116  Temp: 98.1 F (36.7 C)     Physical Exam  Lab Results:  Results for orders placed or performed in visit on 09/17/19 (from the past 24 hour(s))  Urinalysis, Routine w reflex microscopic     Status: Abnormal   Collection Time: 09/17/19  9:44 AM  Result Value Ref Range   Specific Gravity, UA 1.025 1.005 - 1.030   pH, UA 5.5 5.0 - 7.5   Color, UA Yellow Yellow   Appearance Ur Clear Clear   Leukocytes,UA Negative Negative   Protein,UA Trace (A) Negative/Trace   Glucose, UA Negative Negative   Ketones, UA Negative Negative   RBC, UA Trace (A) Negative   Bilirubin, UA Negative Negative   Urobilinogen, Ur 0.2 0.2 - 1.0 mg/dL   Nitrite, UA Negative Negative   Microscopic Examination See below:    Narrative   Performed at:  [facility] [address], [place], [place]  [id number] Lab Director: Mina Marble MT, Phone:  [phone]  Microscopic Examination     Status: None   Collection Time: 09/17/19  9:44 AM   Urine  Result Value Ref  Range   WBC, UA 0-5 0 - 5 /hpf   RBC 0-2 0 - 2 /hpf   Epithelial Cells (non renal) None seen 0 - 10 /hpf   Renal Epithel, UA None seen None seen /hpf   Mucus, UA Present Not Estab.   Bacteria, UA None seen None seen/Few   Narrative   Performed at:  [facility] [address], [place], [place]  [id number] Lab Director: [place], Phone:  [phone]    BMET No results for input(s): NA, K, CL, CO2, GLUCOSE, BUN, CREATININE, CALCIUM in the last 72 hours. PSA PSA  Date Value Ref Range Status  06/11/2019 <0.1 < OR = 4.0 ng/mL Final    Comment:    The total PSA value from this assay system is  standardized against the WHO standard. The test  result will be approximately 20% lower when compared  to the equimolar-standardized total PSA (Beckman  Coulter). Comparison of serial PSA results should be  interpreted with this fact in mind. . This test was performed using the Siemens  chemiluminescent method. Values obtained from  different assay methods cannot be used interchangeably. PSA levels, regardless of value, should not be interpreted as absolute evidence of the presence or absence of disease.    Testosterone  Date Value Ref Range Status  09/09/2019 5 (L) 264 - 916 ng/dL Final    Comment:    Adult male reference interval is based on a population of healthy nonobese males (BMI <30) between 6 and 77 years old. [place], [place] [phone]. PMID: [id number].       Studies/Results: UA reviewed.    Cystoscopy:  He was prepped with betadine and his urethral was instilled with 2% lidocaine jelly.  He was given Cipro 500mg  po.    Urethra was normal. Prostate has bilobar hyperplasia with coaptation. Bladder has mild trabeculation.  There are no mucosal lesions other than radiation changes at the bladder neck. UO's are unremarkable.   Assessment & Plan: History of prostate cancer.  His PSA remains undetectible but he remains castrate.  Repeat  labs in 6 months.  History of CIS.  No recurrence s/p induction BCG.  He will return in 3 months for cystoscopy and BCG maintenance. Urine cytology sent.      Meds ordered this encounter  Medications  . ciprofloxacin (CIPRO) tablet 500 mg     Orders Placed This Encounter  Procedures  . Microscopic Examination  . Urinalysis, Routine w reflex microscopic      Return in about 3 months (around 12/17/2019) for For cystoscopy and then will need BCG maintenance x 3 weeks after that. .   CC: Medicine, [facility]      Irine Seal 09/17/2019

## 2019-09-17 ENCOUNTER — Ambulatory Visit: Payer: Medicare Other | Admitting: Urology

## 2019-09-17 ENCOUNTER — Other Ambulatory Visit: Payer: Self-pay

## 2019-09-17 ENCOUNTER — Encounter: Payer: Self-pay | Admitting: Urology

## 2019-09-17 ENCOUNTER — Ambulatory Visit (INDEPENDENT_AMBULATORY_CARE_PROVIDER_SITE_OTHER): Payer: Medicare Other | Admitting: Urology

## 2019-09-17 VITALS — BP 143/75 | HR 116 | Temp 98.1°F | Ht 70.0 in | Wt 182.0 lb

## 2019-09-17 DIAGNOSIS — Z8551 Personal history of malignant neoplasm of bladder: Secondary | ICD-10-CM

## 2019-09-17 DIAGNOSIS — Z8546 Personal history of malignant neoplasm of prostate: Secondary | ICD-10-CM

## 2019-09-17 DIAGNOSIS — E291 Testicular hypofunction: Secondary | ICD-10-CM

## 2019-09-17 LAB — URINALYSIS, ROUTINE W REFLEX MICROSCOPIC
Bilirubin, UA: NEGATIVE
Glucose, UA: NEGATIVE
Ketones, UA: NEGATIVE
Leukocytes,UA: NEGATIVE
Nitrite, UA: NEGATIVE
Specific Gravity, UA: 1.025 (ref 1.005–1.030)
Urobilinogen, Ur: 0.2 mg/dL (ref 0.2–1.0)
pH, UA: 5.5 (ref 5.0–7.5)

## 2019-09-17 LAB — MICROSCOPIC EXAMINATION
Bacteria, UA: NONE SEEN
Epithelial Cells (non renal): NONE SEEN /hpf (ref 0–10)
Renal Epithel, UA: NONE SEEN /hpf

## 2019-09-17 MED ORDER — CIPROFLOXACIN HCL 500 MG PO TABS
500.0000 mg | ORAL_TABLET | Freq: Once | ORAL | Status: AC
  Administered 2019-09-17: 500 mg via ORAL

## 2019-09-17 NOTE — Progress Notes (Signed)
Urological Symptom Review  Patient is experiencing the following symptoms: Hard to postpone urination Get up at night to urinate Leakage of urine   Review of Systems  Gastrointestinal (upper)  : Negative for upper GI symptoms  Gastrointestinal (lower) : Negative for lower GI symptoms  Constitutional : Negative for symptoms  Skin: Negative for skin symptoms  Eyes: Negative for eye symptoms  Ear/Nose/Throat : Negative for Ear/Nose/Throat symptoms  Hematologic/Lymphatic: Easy bruising  Cardiovascular : Negative for cardiovascular symptoms  Respiratory : Shortness of breath  Endocrine: Negative for endocrine symptoms  Musculoskeletal: Negative for musculoskeletal symptoms  Neurological: Negative for neurological symptoms  Psychologic: Negative for psychiatric symptoms

## 2019-09-22 LAB — CYTOLOGY, URINE

## 2019-09-22 NOTE — Progress Notes (Signed)
Results mailed 

## 2019-09-29 ENCOUNTER — Ambulatory Visit: Payer: Medicare Other | Admitting: Pulmonary Disease

## 2019-09-29 ENCOUNTER — Encounter: Payer: Self-pay | Admitting: Pulmonary Disease

## 2019-09-29 ENCOUNTER — Other Ambulatory Visit: Payer: Self-pay

## 2019-09-29 VITALS — BP 118/70 | HR 81 | Temp 98.6°F | Ht 70.0 in | Wt 178.2 lb

## 2019-09-29 DIAGNOSIS — J449 Chronic obstructive pulmonary disease, unspecified: Secondary | ICD-10-CM

## 2019-09-29 DIAGNOSIS — J9611 Chronic respiratory failure with hypoxia: Secondary | ICD-10-CM | POA: Diagnosis not present

## 2019-09-29 DIAGNOSIS — Z7189 Other specified counseling: Secondary | ICD-10-CM | POA: Diagnosis not present

## 2019-09-29 DIAGNOSIS — J439 Emphysema, unspecified: Secondary | ICD-10-CM

## 2019-09-29 DIAGNOSIS — J432 Centrilobular emphysema: Secondary | ICD-10-CM

## 2019-09-29 MED ORDER — ALBUTEROL SULFATE HFA 108 (90 BASE) MCG/ACT IN AERS: 2.0000 | INHALATION_SPRAY | Freq: Four times a day (QID) | RESPIRATORY_TRACT | 6 refills | Status: AC | PRN

## 2019-09-29 NOTE — Progress Notes (Signed)
[facility]  Chief Complaint  Patient presents with   Follow-up    shortness of breath with activity    Constitutional:  BP 118/70 (BP Location: Left Arm, Cuff Size: Normal)    Pulse 81    Temp 98.6 F (37 C) (Other (Comment)) Comment (Src): wrist   Ht 5\' 10"  (1.778 m)    Wt 178 lb 3.2 oz (80.8 kg)    SpO2 96% Comment: 3L pulse O2   BMI 25.57 kg/m   Past Medical History:  HTN, OA, AAA, Prostate cancer  Past Surgical History:  His  has a past surgical history that includes Prostate biopsy (02-28-2017   dr Jeffie Pollock office); Appendectomy (1960s); Tonsillectomy (age 21); Laparoscopic cholecystectomy (1992); Radioactive seed implant (N/A, 07/31/2017); SPACE OAR INSTILLATION (N/A, 07/31/2017); and Transurethral resection of bladder tumor with mitomycin-c (N/A, 05/06/2019).  Brief Summary:  Mark Cline is a 74 y.o. male former smoker with COPD/emphysema and chronic hypoxic respiratory failure.      Subjective:  He gets winded with activity, especially when outside in hot weather.  Gets intermittent cough and wheeze.  Tried trelegy - didn't help much and was too expensive.  Has been using Advair bid.  Uses albuterol few times per week.  Using 3 liters oxygen 24/7.  Physical Exam:   Appearance - well kempt, wearing oxygen  ENMT - no sinus tenderness, no oral exudate, no LAN, Mallampati 3 airway, no stridor  Respiratory - equal breath sounds bilaterally, no wheezing or rales  CV - s1s2 regular rate and rhythm, no murmurs  Ext - no clubbing, no edema  Skin - no rashes  Psych - normal mood and affect   Pulmonary testing:   A1AT 09/10/18 >>139, MM   ONO with RA 09/14/18 >>test time 13 hrs 58 min. Baseline SpO2 91%, low SpO2 77%. Spent 2 hrs 7 min with SpO2 <88%.  Chest Imaging:   CT chest 07/15/17 >> atherosclerosis, calcified Rt hilar/paratracheal/subcarinal LN, advanced changes of emphysema, bronchial wall thickening, 1.5 cm  density RUL, calcified granuloma RML and liver/spleen  CT chest 12/09/17 >> stable RUL density, new patchy ASD lower lobes Lt > Rt  Social History:  He  reports that he quit smoking about 3 years ago. His smoking use included cigarettes. He has a 30.00 pack-year smoking history. He has never used smokeless tobacco. He reports that he does not drink alcohol and does not use drugs.  Family History:  His family history includes Cancer (age of onset: 49) in his father.     Assessment/Plan:   COPD with emphysema and chronic bronchitis. - trelegy was too expensive - continue advair and prn albuterol - discussed roles of his inhalers and when to use these - he will get high dose influenza vaccine when available  Chronic respiratory failure with hypoxia. - 3 liters pulsed oxygen 24/7 - goal SpO2 > 90%  COVID 19 advice. - discussed that booster vaccine is still be debated - if he is eligible for booster vaccine, then he would likely need to plan for this in November 2021  Time Spent Involved in Patient Care on Day of Examination:  22 minutes  Follow up:  Patient Instructions  Follow up in 6 months   Medication List:   Allergies as of 09/29/2019   No Known Allergies     Medication List       Accurate as of September 29, 2019 12:43 PM. If you have any questions, ask your  nurse or doctor.        STOP taking these medications   traMADol 50 MG tablet Commonly known as: ULTRAM Stopped by: [name], MD   Trelegy Ellipta 100-62.5-25 MCG/INH Aepb Generic drug: Fluticasone-Umeclidin-Vilant Stopped by: [name], MD     TAKE these medications   albuterol 108 (90 Base) MCG/ACT inhaler Commonly known as: VENTOLIN HFA Inhale 2 puffs into the lungs every 6 (six) hours as needed for wheezing or shortness of breath. Started by: [name], MD   Fluticasone-Salmeterol 250-50 MCG/DOSE Aepb Commonly known as: ADVAIR SMARTSIG:By Mouth   lisinopril 10 MG tablet Commonly  known as: ZESTRIL Take 10 mg by mouth daily.       Signature:  [name], MD [place] Pager - [phone] 09/29/2019, 12:43 PM

## 2019-09-29 NOTE — Patient Instructions (Signed)
Follow up in 6 months 

## 2019-10-06 DIAGNOSIS — J449 Chronic obstructive pulmonary disease, unspecified: Secondary | ICD-10-CM | POA: Diagnosis not present

## 2019-10-08 ENCOUNTER — Other Ambulatory Visit: Payer: Self-pay

## 2019-10-08 DIAGNOSIS — I714 Abdominal aortic aneurysm, without rupture, unspecified: Secondary | ICD-10-CM

## 2019-10-14 DIAGNOSIS — J449 Chronic obstructive pulmonary disease, unspecified: Secondary | ICD-10-CM | POA: Diagnosis not present

## 2019-10-14 DIAGNOSIS — E7800 Pure hypercholesterolemia, unspecified: Secondary | ICD-10-CM | POA: Diagnosis not present

## 2019-10-14 DIAGNOSIS — I10 Essential (primary) hypertension: Secondary | ICD-10-CM | POA: Diagnosis not present

## 2019-11-05 DIAGNOSIS — J449 Chronic obstructive pulmonary disease, unspecified: Secondary | ICD-10-CM | POA: Diagnosis not present

## 2019-11-12 ENCOUNTER — Ambulatory Visit (HOSPITAL_COMMUNITY)
Admission: RE | Admit: 2019-11-12 | Discharge: 2019-11-12 | Disposition: A | Discharge status: Home / Self Care | Payer: Medicare Other | Source: Ambulatory Visit | Attending: Vascular Surgery | Admitting: Vascular Surgery

## 2019-11-12 ENCOUNTER — Other Ambulatory Visit: Payer: Self-pay

## 2019-11-12 DIAGNOSIS — I714 Abdominal aortic aneurysm, without rupture, unspecified: Secondary | ICD-10-CM

## 2019-11-12 DIAGNOSIS — K5730 Diverticulosis of large intestine without perforation or abscess without bleeding: Secondary | ICD-10-CM | POA: Diagnosis not present

## 2019-11-12 DIAGNOSIS — I712 Thoracic aortic aneurysm, without rupture: Secondary | ICD-10-CM | POA: Diagnosis not present

## 2019-11-12 DIAGNOSIS — K4090 Unilateral inguinal hernia, without obstruction or gangrene, not specified as recurrent: Secondary | ICD-10-CM | POA: Diagnosis not present

## 2019-11-12 LAB — POCT I-STAT CREATININE: Creatinine, Ser: 0.7 mg/dL (ref 0.61–1.24)

## 2019-11-12 MED ORDER — IOHEXOL 350 MG/ML SOLN
100.0000 mL | Freq: Once | INTRAVENOUS | Status: AC | PRN
  Administered 2019-11-12: 100 mL via INTRAVENOUS

## 2019-11-15 ENCOUNTER — Ambulatory Visit: Payer: Medicare Other | Admitting: Vascular Surgery

## 2019-11-15 ENCOUNTER — Other Ambulatory Visit: Payer: Self-pay

## 2019-11-15 ENCOUNTER — Encounter: Payer: Self-pay | Admitting: Vascular Surgery

## 2019-11-15 VITALS — BP 157/83 | HR 95 | Temp 98.5°F | Resp 16 | Ht 70.0 in | Wt 185.0 lb

## 2019-11-15 DIAGNOSIS — I714 Abdominal aortic aneurysm, without rupture, unspecified: Secondary | ICD-10-CM

## 2019-11-15 NOTE — Progress Notes (Signed)
[facility]  Patient name: Mark Cline MRN: [id number] DOB: [birth year] Sex: male  REASON FOR VISIT: Follow-up known abdominal aortic aneurysm  HPI: Mark Cline is a 74 y.o. male here today for follow-up.  He is here with his wife.  He has no new medical difficulty.  He does have severe COPD with constant oxygen therapy.  Also has bladder cancer being treated.  He has no symptoms referable to his aneurysm.  He recently underwent repeat CT scan and is here for discussion of this.  Past Medical History:  Diagnosis Date  . AAA (abdominal aortic aneurysm) ([place]) followed by pcp   infarenal , per CT 03-17-2017 , 4.4cm  . Arthritis    hands  . COPD with emphysema ([place])    followed by pcp. 2LO2  . Dyspnea on exertion   . History of amputation of finger age 58  . Hypertension   . Prostate cancer [facility]) urologist-  dr wrenn/  onologist-- dr Tammi Klippel   dx 02-28-2017-- STage T2b, Gleason 4+5, PSA 8.8-- scheduled for radiactive seed implants 07-31-2017 then IMRT  . Wears glasses   . Wears partial dentures    upper    Family History  Problem Relation Age of Onset  . Cancer Father 85       lung    SOCIAL HISTORY: Social History   Tobacco Use  . Smoking status: Former Smoker    Packs/day: 0.50    Years: 60.00    Pack years: 30.00    Types: Cigarettes    Quit date: 07/28/2016    Years since quitting: 3.3  . Smokeless tobacco: Never Used  Substance Use Topics  . Alcohol use: No    No Known Allergies  Current Outpatient Medications  Medication Sig Dispense Refill  . albuterol (VENTOLIN HFA) 108 (90 Base) MCG/ACT inhaler Inhale 2 puffs into the lungs every 6 (six) hours as needed for wheezing or shortness of breath. 8 g 6  . Fluticasone-Salmeterol (ADVAIR) 250-50 MCG/DOSE AEPB SMARTSIG:By Mouth    . lisinopril (PRINIVIL,ZESTRIL) 10 MG tablet Take 10 mg by mouth daily.      No current facility-administered  medications for this visit.    REVIEW OF SYSTEMS:  [X]  denotes positive finding, [ ]  denotes negative finding Cardiac  Comments:  Chest pain or chest pressure:    Shortness of breath upon exertion: x   Short of breath when lying flat:    Irregular heart rhythm:        Vascular    Pain in calf, thigh, or hip brought on by ambulation:    Pain in feet at night that wakes you up from your sleep:     Blood clot in your veins:    Leg swelling:           PHYSICAL EXAM: Vitals:   11/15/19 0928  BP: (!) 157/83  Pulse: 95  Resp: 16  Temp: 98.5 F (36.9 C)  TempSrc: Oral  SpO2: 95%  Weight: 185 lb (83.9 kg)  Height: 5\' 10"  (1.778 m)    GENERAL: The patient is a well-nourished male, in no acute distress. The vital signs are documented above. CARDIOVASCULAR: 2+ radial pulses bilaterally.  2+ femoral pulses.  Absent popliteal and distal pulses with no evidence of peripheral aneurysm.  I do not feel an abdominal aortic aneurysm PULMONARY: There is good air exchange  MUSCULOSKELETAL: There are no major deformities or cyanosis. NEUROLOGIC: No focal weakness or paresthesias are  detected. SKIN: There are no ulcers or rashes noted. PSYCHIATRIC: The patient has a normal affect.  DATA:  CT scan from 11/12/2019 was reviewed with the patient and his wife.  This shows no change in his maximal diameter.  He does have tortuosity which in all likelihood overestimates his maximal diameter.  His CT is unchanged from 1 year ago.  Maximal diameter is possibly 5.0 but in all likelihood is less than is related to his tortuosity.  He does have severe aortoiliac calcification and superficial femoral arteries occluded bilaterally near their origin  MEDICAL ISSUES: Stable aneurysm size.  Probable overestimation at 5.0.  Radiology feels this more likely is slightly less than 4 and I concur.  I would recommend ultrasound follow-up in 1 year and we will coordinate this.    Rosetta Posner, MD FACS [facility] Tel [phone]

## 2019-11-19 DIAGNOSIS — J449 Chronic obstructive pulmonary disease, unspecified: Secondary | ICD-10-CM | POA: Diagnosis not present

## 2019-11-19 DIAGNOSIS — E782 Mixed hyperlipidemia: Secondary | ICD-10-CM | POA: Diagnosis not present

## 2019-11-19 DIAGNOSIS — R079 Chest pain, unspecified: Secondary | ICD-10-CM | POA: Diagnosis not present

## 2019-11-19 DIAGNOSIS — Z299 Encounter for prophylactic measures, unspecified: Secondary | ICD-10-CM | POA: Diagnosis not present

## 2019-11-19 DIAGNOSIS — I10 Essential (primary) hypertension: Secondary | ICD-10-CM | POA: Diagnosis not present

## 2019-11-29 DIAGNOSIS — M419 Scoliosis, unspecified: Secondary | ICD-10-CM | POA: Diagnosis not present

## 2019-11-29 DIAGNOSIS — M47814 Spondylosis without myelopathy or radiculopathy, thoracic region: Secondary | ICD-10-CM | POA: Diagnosis not present

## 2019-11-29 DIAGNOSIS — I700 Atherosclerosis of aorta: Secondary | ICD-10-CM | POA: Diagnosis not present

## 2019-11-29 DIAGNOSIS — M25511 Pain in right shoulder: Secondary | ICD-10-CM | POA: Diagnosis not present

## 2019-12-06 DIAGNOSIS — J449 Chronic obstructive pulmonary disease, unspecified: Secondary | ICD-10-CM | POA: Diagnosis not present

## 2019-12-14 DIAGNOSIS — J449 Chronic obstructive pulmonary disease, unspecified: Secondary | ICD-10-CM | POA: Diagnosis not present

## 2019-12-14 DIAGNOSIS — E7800 Pure hypercholesterolemia, unspecified: Secondary | ICD-10-CM | POA: Diagnosis not present

## 2019-12-14 DIAGNOSIS — I10 Essential (primary) hypertension: Secondary | ICD-10-CM | POA: Diagnosis not present

## 2019-12-17 ENCOUNTER — Other Ambulatory Visit: Payer: Self-pay

## 2019-12-17 ENCOUNTER — Ambulatory Visit (INDEPENDENT_AMBULATORY_CARE_PROVIDER_SITE_OTHER): Payer: Medicare Other | Admitting: Urology

## 2019-12-17 ENCOUNTER — Encounter: Payer: Self-pay | Admitting: Urology

## 2019-12-17 VITALS — BP 149/89 | HR 116 | Temp 98.2°F | Ht 70.0 in | Wt 182.0 lb

## 2019-12-17 DIAGNOSIS — Z8551 Personal history of malignant neoplasm of bladder: Secondary | ICD-10-CM

## 2019-12-17 LAB — URINALYSIS, ROUTINE W REFLEX MICROSCOPIC
Bilirubin, UA: NEGATIVE
Glucose, UA: NEGATIVE
Leukocytes,UA: NEGATIVE
Nitrite, UA: NEGATIVE
Specific Gravity, UA: >1.03 — ABNORMAL HIGH (ref 1.005–1.030)
Urobilinogen, Ur: 1 mg/dL (ref 0.2–1.0)
pH, UA: 6 (ref 5.0–7.5)

## 2019-12-17 LAB — MICROSCOPIC EXAMINATION
Epithelial Cells (non renal): NONE SEEN /hpf (ref 0–10)
Renal Epithel, UA: NONE SEEN /hpf
WBC, UA: NONE SEEN /hpf (ref 0–5)

## 2019-12-17 MED ORDER — CIPROFLOXACIN HCL 500 MG PO TABS: 500.0000 mg | ORAL_TABLET | Freq: Once | ORAL | Status: DC

## 2019-12-17 NOTE — Addendum Note (Signed)
Addended by: Valentina Lucks on: 12/17/2019 01:22 PM   Modules accepted: Orders

## 2019-12-17 NOTE — Progress Notes (Signed)
Urological Symptom Review  Patient is experiencing the following symptoms: Erection problems (male only)   Review of Systems  Gastrointestinal (upper)  : Negative for upper GI symptoms  Gastrointestinal (lower) : Negative for lower GI symptoms  Constitutional : Negative for symptoms  Skin: Negative for skin symptoms  Eyes: Negative for eye symptoms  Ear/Nose/Throat : Negative for Ear/Nose/Throat symptoms  Hematologic/Lymphatic: Negative for Hematologic/Lymphatic symptoms  Cardiovascular : None  Respiratory : Shortness of breath  Endocrine: Negative for endocrine symptoms  Musculoskeletal: Negative for musculoskeletal symptoms  Neurological: Negative for neurological symptoms  Psychologic: Negative for psychiatric symptoms

## 2019-12-17 NOTE — Addendum Note (Signed)
Addended by: Valentina Lucks on: 12/17/2019 03:20 PM   Modules accepted: Orders

## 2019-12-17 NOTE — Progress Notes (Signed)
   12/17/19  CC:  Hx of bladder cancer  HPI: Mark Cline is a 74yo here for followup for CIS of the bladder. No hematuria or dysuria. He is schedukeld to start maintenance BCG in 1 week. Blood pressure (!) 149/89, pulse (!) 116, temperature 98.2 F (36.8 C), height 5\' 10"  (1.778 m), weight 182 lb (82.6 kg). NED. A&Ox3.   No respiratory distress   Abd soft, NT, ND Normal phallus with bilateral descended testicles  Cystoscopy Procedure Note  Patient identification was confirmed, informed consent was obtained, and patient was prepped using Betadine solution.  Lidocaine jelly was administered per urethral meatus.     Pre-Procedure: - Inspection reveals a normal caliber ureteral meatus.  Procedure: The flexible cystoscope was introduced without difficulty - No urethral strictures/lesions are present. - Enlarged prostate  - Normal bladder neck - Bilateral ureteral orifices identified - Bladder mucosa  reveals no ulcers, tumors, or lesions - No bladder stones -Mild trabeculation     Post-Procedure: - Patient tolerated the procedure well  Assessment/ Plan:  Urine for cytology Return in about 3 months (around 03/16/2020) for cystoscopy and PSA with Dr. Jeffie Pollock.  Nicolette Bang, MD

## 2019-12-17 NOTE — Patient Instructions (Signed)
Bladder Cancer  Bladder cancer is an abnormal growth of tissue in the bladder. The bladder is the balloon-like sac in the pelvis. It collects and stores urine that comes from the kidneys through the ureters. The bladder wall is made of layers. If cancer spreads into these layers and through the wall of the bladder, it becomes more difficult to treat. What are the causes? The cause of this condition is not known. What increases the risk? The following factors may make you more likely to develop this condition:  Smoking.  Workplace risks (occupational exposures), such as rubber, leather, textile, dyes, chemicals, and paint.  Being white.  Your age. Most people with bladder cancer are over the age of 88.  Being male.  Having chronic bladder inflammation.  Having a personal history of bladder cancer.  Having a family history of bladder cancer (heredity).  Having had chemotherapy or radiation therapy to the pelvis.  Having been exposed to arsenic. What are the signs or symptoms? Initial symptoms of this condition include:  Blood in the urine.  Painful urination.  Frequent bladder or urine infections.  Increase in urgency and frequency of urination. Advanced symptoms of this condition include:  Not being able to urinate.  Low back pain on one side.  Loss of appetite.  Weight loss.  Fatigue.  Swelling in the feet.  Bone pain. How is this diagnosed? This condition is diagnosed based on your medical history, a physical exam, urine tests, lab tests, imaging tests, and your symptoms. You may also have other tests or procedures done, such as:  A narrow tube being inserted into your bladder through your urethra (cystoscopy) in order to view the lining of your bladder for tumors.  A biopsy to sample the tumor to see if cancer is present. If cancer is present, it will then be staged to determine its severity and extent. Staging is an assessment of:  The size of the  tumor.  Whether the cancer has spread.  Where the cancer has spread. It is important to know how deeply into the bladder wall cancer has grown and whether cancer has spread to any other parts of your body. Staging may require blood tests or imaging tests, such as a CT scan, MRI, bone scan, or chest X-ray. How is this treated? Based on the stage of cancer, one treatment or a combination of treatments may be recommended. The most common forms of treatment are:  Surgery to remove the cancer. Procedures that may be done include transurethral resection and cystectomy.  Radiation therapy. This is high-energy X-rays or other particles. This is often used in combination with chemotherapy.  Chemotherapy. During this treatment, medicines are used to kill cancer cells.  Immunotherapy. This uses medicines to help your own immune system destroy cancer cells. Follow these instructions at home:  Take over-the-counter and prescription medicines only as told by your health care provider.  Maintain a healthy diet. Some of your treatments might affect your appetite.  Consider joining a support group. This may help you learn to cope with the stress of having bladder cancer.  Tell your cancer care team if you develop side effects. They may be able to recommend ways to relieve them.  Keep all follow-up visits as told by your health care provider. This is important. Where to find more information  American Cancer Society: www.cancer.[place] ([place]): www.cancer.gov Contact a health care provider if:  You have symptoms of a urinary tract infection. These include: ?  Fever. ? Chills. ? Weakness. ? Muscle aches. ? Abdominal pain. ? Frequent and intense urge to urinate. ? Burning feeling in the bladder or urethra during urination. Get help right away if:  There is blood in your urine.  You cannot urinate.  You have severe pain or other symptoms that do not go  away. Summary  Bladder cancer is an abnormal growth of tissue in the bladder.  This condition is diagnosed based on your medical history, a physical exam, urine tests, lab tests, imaging tests, and your symptoms.  Based on the stage of cancer, surgery, chemotherapy, or a combination of treatments may be recommended.  Consider joining a support group. This may help you learn to cope with the stress of having bladder cancer. This information is not intended to replace advice given to you by your health care provider. Make sure you discuss any questions you have with your health care provider. Document Revised: 12/13/2016 Document Reviewed: 12/05/2015 Elsevier Patient Education  2020 [employer].

## 2019-12-20 LAB — CYTOLOGY, URINE

## 2019-12-22 ENCOUNTER — Telehealth: Payer: Self-pay

## 2019-12-22 NOTE — Telephone Encounter (Signed)
Pt notified of urine results.

## 2019-12-24 ENCOUNTER — Ambulatory Visit (INDEPENDENT_AMBULATORY_CARE_PROVIDER_SITE_OTHER): Payer: Medicare Other

## 2019-12-24 ENCOUNTER — Other Ambulatory Visit: Payer: Self-pay

## 2019-12-24 DIAGNOSIS — Z8551 Personal history of malignant neoplasm of bladder: Secondary | ICD-10-CM

## 2019-12-24 LAB — MICROSCOPIC EXAMINATION
Bacteria, UA: NONE SEEN
Epithelial Cells (non renal): NONE SEEN /hpf (ref 0–10)
Renal Epithel, UA: NONE SEEN /hpf
WBC, UA: NONE SEEN /hpf (ref 0–5)

## 2019-12-24 LAB — URINALYSIS, ROUTINE W REFLEX MICROSCOPIC
Bilirubin, UA: NEGATIVE
Glucose, UA: NEGATIVE
Leukocytes,UA: NEGATIVE
Nitrite, UA: NEGATIVE
Specific Gravity, UA: >1.03 — ABNORMAL HIGH (ref 1.005–1.030)
Urobilinogen, Ur: 1 mg/dL (ref 0.2–1.0)
pH, UA: 6 (ref 5.0–7.5)

## 2019-12-24 MED ORDER — BCG LIVE 50 MG IS SUSR
3.2400 mL | Freq: Once | INTRAVESICAL | Status: AC
  Administered 2019-12-24: 81 mg via INTRAVESICAL

## 2019-12-24 NOTE — Progress Notes (Signed)
BCG Bladder Instillation  BCG #  1 of 3  Due to Bladder Cancer patient is present today for a BCG treatment. Patient was cleaned and prepped in a sterile fashion with betadine. A 14FR catheter was inserted, but I could not get it passed the prostate. Then Hope C. LPN came in a tried with a 16 fr coude cath and she could not get it in. Dr. Jeffie Pollock then came in and  with Lidocaine gel got cath inserted. Urine return was noted 77ml, urine was yellow in color.  17ml of reconstituted BCG was instilled into the bladder. The catheter was then removed. Patient tolerated well afterward.  Preformed by: Valentina Lucks, LPN  Follow up/ Additional notes: 1 wk

## 2019-12-31 ENCOUNTER — Other Ambulatory Visit: Payer: Self-pay

## 2019-12-31 ENCOUNTER — Ambulatory Visit (INDEPENDENT_AMBULATORY_CARE_PROVIDER_SITE_OTHER): Payer: Medicare Other

## 2019-12-31 DIAGNOSIS — C61 Malignant neoplasm of prostate: Secondary | ICD-10-CM | POA: Diagnosis not present

## 2019-12-31 LAB — MICROSCOPIC EXAMINATION
RBC, Urine: >30 /hpf — AB (ref 0–2)
Renal Epithel, UA: NONE SEEN /hpf

## 2019-12-31 LAB — URINALYSIS, ROUTINE W REFLEX MICROSCOPIC
Bilirubin, UA: NEGATIVE
Glucose, UA: NEGATIVE
Nitrite, UA: NEGATIVE
Specific Gravity, UA: 1.025 (ref 1.005–1.030)
Urobilinogen, Ur: 1 mg/dL (ref 0.2–1.0)
pH, UA: 7 (ref 5.0–7.5)

## 2019-12-31 MED ORDER — BCG LIVE 50 MG IS SUSR: 3.2400 mL | Freq: Once | INTRAVESICAL | Status: DC

## 2019-12-31 NOTE — Progress Notes (Signed)
Patient arrived today for BCG treatment #2.   Urine results reviewed with Dr. Jeffie Pollock. Pt last visit 7 days ago were negative for bacteria and leuk.  Positive today. Pt denies symptoms however upon inserting cath with positive urine return, urine was strong foul odor.  Spoke with Dr. Jeffie Pollock who would like urine culture and post pone treatment. Urine sent for culture today.

## 2020-01-05 ENCOUNTER — Ambulatory Visit: Payer: Medicare Other

## 2020-01-05 ENCOUNTER — Other Ambulatory Visit: Payer: Self-pay

## 2020-01-05 DIAGNOSIS — J449 Chronic obstructive pulmonary disease, unspecified: Secondary | ICD-10-CM | POA: Diagnosis not present

## 2020-01-05 DIAGNOSIS — C61 Malignant neoplasm of prostate: Secondary | ICD-10-CM

## 2020-01-05 LAB — MICROSCOPIC EXAMINATION: Renal Epithel, UA: NONE SEEN /hpf

## 2020-01-05 LAB — URINALYSIS, ROUTINE W REFLEX MICROSCOPIC
Bilirubin, UA: NEGATIVE
Glucose, UA: NEGATIVE
Nitrite, UA: POSITIVE — AB
Specific Gravity, UA: >1.03 — ABNORMAL HIGH (ref 1.005–1.030)
Urobilinogen, Ur: 2 mg/dL — ABNORMAL HIGH (ref 0.2–1.0)
pH, UA: 6 (ref 5.0–7.5)

## 2020-01-05 MED ORDER — SULFAMETHOXAZOLE-TRIMETHOPRIM 800-160 MG PO TABS: 1.0000 | ORAL_TABLET | Freq: Two times a day (BID) | ORAL | 0 refills | Status: DC

## 2020-01-05 NOTE — Progress Notes (Signed)
Pt came in today and his preliminary of urine culture was still showing infection. Unable to do BCG. Dr. Alyson Ingles sent in Bactrim for pt. Pt rescheduled for next 2  weeks.

## 2020-01-06 ENCOUNTER — Telehealth: Payer: Self-pay

## 2020-01-06 ENCOUNTER — Ambulatory Visit: Payer: Medicare Other

## 2020-01-06 DIAGNOSIS — N390 Urinary tract infection, site not specified: Secondary | ICD-10-CM

## 2020-01-06 DIAGNOSIS — C61 Malignant neoplasm of prostate: Secondary | ICD-10-CM

## 2020-01-06 LAB — URINE CULTURE

## 2020-01-06 MED ORDER — NITROFURANTOIN MONOHYD MACRO 100 MG PO CAPS: 100.0000 mg | ORAL_CAPSULE | Freq: Two times a day (BID) | ORAL | 0 refills | Status: DC

## 2020-01-06 NOTE — Telephone Encounter (Signed)
-----   Message from Irine Seal, MD sent at 01/06/2020 11:27 AM EST ----- He needs Macrobid 100mg  po bid #14.

## 2020-01-06 NOTE — Telephone Encounter (Signed)
Pt called and made aware

## 2020-01-13 ENCOUNTER — Ambulatory Visit: Payer: Medicare Other

## 2020-01-20 ENCOUNTER — Other Ambulatory Visit: Payer: Self-pay

## 2020-01-20 ENCOUNTER — Ambulatory Visit (INDEPENDENT_AMBULATORY_CARE_PROVIDER_SITE_OTHER): Payer: Medicare Other

## 2020-01-20 DIAGNOSIS — C61 Malignant neoplasm of prostate: Secondary | ICD-10-CM | POA: Diagnosis not present

## 2020-01-20 MED ORDER — BCG LIVE 50 MG IS SUSR
3.2400 mL | Freq: Once | INTRAVESICAL | Status: AC
  Administered 2020-01-20: 81 mg via INTRAVESICAL

## 2020-01-20 NOTE — Progress Notes (Signed)
BCG Bladder Instillation  BCG # 2  Due to Bladder Cancer patient is present today for a BCG treatment. Patient was cleaned and prepped in a sterile fashion with betadine. A 14FR catheter was inserted, urine return was noted 157ml, urine was dark yellow in color.  35ml of reconstituted BCG was instilled into the bladder. The catheter was then removed. Patient tolerated well, no complications were noted  Performed by: Glennis Borger, lpn  Follow up/ Additional notes: Keep next scheduled NV

## 2020-01-20 NOTE — Patient Instructions (Signed)

## 2020-01-24 DIAGNOSIS — Z87891 Personal history of nicotine dependence: Secondary | ICD-10-CM | POA: Diagnosis not present

## 2020-01-24 DIAGNOSIS — Z299 Encounter for prophylactic measures, unspecified: Secondary | ICD-10-CM | POA: Diagnosis not present

## 2020-01-24 DIAGNOSIS — I714 Abdominal aortic aneurysm, without rupture: Secondary | ICD-10-CM | POA: Diagnosis not present

## 2020-01-24 DIAGNOSIS — R079 Chest pain, unspecified: Secondary | ICD-10-CM | POA: Diagnosis not present

## 2020-01-24 DIAGNOSIS — I700 Atherosclerosis of aorta: Secondary | ICD-10-CM | POA: Diagnosis not present

## 2020-01-24 DIAGNOSIS — I10 Essential (primary) hypertension: Secondary | ICD-10-CM | POA: Diagnosis not present

## 2020-01-25 DIAGNOSIS — E782 Mixed hyperlipidemia: Secondary | ICD-10-CM | POA: Diagnosis not present

## 2020-01-25 DIAGNOSIS — I10 Essential (primary) hypertension: Secondary | ICD-10-CM | POA: Diagnosis not present

## 2020-01-25 DIAGNOSIS — M79643 Pain in unspecified hand: Secondary | ICD-10-CM | POA: Diagnosis not present

## 2020-01-27 ENCOUNTER — Telehealth: Payer: Self-pay

## 2020-01-27 ENCOUNTER — Other Ambulatory Visit: Payer: Self-pay | Admitting: Urology

## 2020-01-27 ENCOUNTER — Other Ambulatory Visit: Payer: Self-pay

## 2020-01-27 ENCOUNTER — Ambulatory Visit (INDEPENDENT_AMBULATORY_CARE_PROVIDER_SITE_OTHER): Payer: Medicare Other

## 2020-01-27 DIAGNOSIS — C678 Malignant neoplasm of overlapping sites of bladder: Secondary | ICD-10-CM

## 2020-01-27 DIAGNOSIS — N390 Urinary tract infection, site not specified: Secondary | ICD-10-CM

## 2020-01-27 LAB — URINALYSIS, ROUTINE W REFLEX MICROSCOPIC
Bilirubin, UA: NEGATIVE
Glucose, UA: NEGATIVE
Nitrite, UA: POSITIVE — AB
Specific Gravity, UA: 1.02 (ref 1.005–1.030)
Urobilinogen, Ur: 1 mg/dL (ref 0.2–1.0)
pH, UA: >9 — ABNORMAL HIGH (ref 5.0–7.5)

## 2020-01-27 LAB — MICROSCOPIC EXAMINATION: Renal Epithel, UA: NONE SEEN /hpf

## 2020-01-27 MED ORDER — CIPROFLOXACIN HCL 500 MG PO TABS: 500.0000 mg | ORAL_TABLET | Freq: Two times a day (BID) | ORAL | 0 refills | Status: DC

## 2020-01-27 MED ORDER — BCG LIVE 50 MG IS SUSR: 3.2400 mL | Freq: Once | INTRAVESICAL | Status: DC

## 2020-01-27 NOTE — Progress Notes (Signed)
Me resent.

## 2020-01-27 NOTE — Telephone Encounter (Signed)
Opened in error

## 2020-01-27 NOTE — Progress Notes (Signed)
Due to possible UTI BCG #3 rescheduled. Urine sent for culture.  Pt started on Cipro 500 mg po bid x 3 days pending culture.

## 2020-01-31 LAB — URINE CULTURE

## 2020-02-01 ENCOUNTER — Telehealth: Payer: Self-pay

## 2020-02-01 ENCOUNTER — Telehealth: Payer: Self-pay | Admitting: Urology

## 2020-02-01 DIAGNOSIS — N390 Urinary tract infection, site not specified: Secondary | ICD-10-CM

## 2020-02-01 MED ORDER — CEPHALEXIN 500 MG PO CAPS: 500.0000 mg | ORAL_CAPSULE | Freq: Three times a day (TID) | ORAL | 0 refills | Status: DC

## 2020-02-01 NOTE — Telephone Encounter (Signed)
Call returned and medication issue addressed.

## 2020-02-01 NOTE — Telephone Encounter (Signed)
Wife called and made aware. Appt rescheduled.

## 2020-02-01 NOTE — Telephone Encounter (Signed)
-----   Message from Irine Seal, MD sent at 02/01/2020  7:32 AM EST ----- His culture is positive but resistant to Cipro.  He needs cephalexin 500mg  po tid #21 and will need to have his BCG held until next week since he will not have had sufficient treatment by Thursday.

## 2020-02-01 NOTE — Telephone Encounter (Signed)
Patients caretaker called and asked for a nurse to return their call regarding a medication issue.

## 2020-02-05 DIAGNOSIS — J449 Chronic obstructive pulmonary disease, unspecified: Secondary | ICD-10-CM | POA: Diagnosis not present

## 2020-02-08 ENCOUNTER — Ambulatory Visit: Payer: Medicare Other

## 2020-02-08 DIAGNOSIS — M47814 Spondylosis without myelopathy or radiculopathy, thoracic region: Secondary | ICD-10-CM | POA: Diagnosis not present

## 2020-02-08 DIAGNOSIS — M546 Pain in thoracic spine: Secondary | ICD-10-CM | POA: Diagnosis not present

## 2020-02-08 DIAGNOSIS — M9902 Segmental and somatic dysfunction of thoracic region: Secondary | ICD-10-CM | POA: Diagnosis not present

## 2020-02-09 ENCOUNTER — Other Ambulatory Visit: Payer: Self-pay

## 2020-02-09 ENCOUNTER — Ambulatory Visit: Payer: Medicare Other

## 2020-02-09 DIAGNOSIS — N390 Urinary tract infection, site not specified: Secondary | ICD-10-CM | POA: Diagnosis not present

## 2020-02-09 LAB — MICROSCOPIC EXAMINATION
Renal Epithel, UA: NONE SEEN /hpf
WBC, UA: >30 /hpf — AB (ref 0–5)

## 2020-02-09 LAB — URINALYSIS, ROUTINE W REFLEX MICROSCOPIC
Bilirubin, UA: NEGATIVE
Glucose, UA: NEGATIVE
Nitrite, UA: NEGATIVE
Specific Gravity, UA: >1.03 — ABNORMAL HIGH (ref 1.005–1.030)
Urobilinogen, Ur: 0.2 mg/dL (ref 0.2–1.0)
pH, UA: 5.5 (ref 5.0–7.5)

## 2020-02-09 MED ORDER — NITROFURANTOIN MONOHYD MACRO 100 MG PO CAPS: 100.0000 mg | ORAL_CAPSULE | Freq: Two times a day (BID) | ORAL | 0 refills | Status: DC

## 2020-02-09 NOTE — Addendum Note (Signed)
Addended by: Dorisann Frames on: 02/09/2020 11:02 AM   Modules accepted: Orders

## 2020-02-09 NOTE — Progress Notes (Signed)
Patient had stopped his antibiotic after 5 days of therapy. Reports his UTI symptoms started back Monday- spoke with Dr. Alyson Ingles today.  New antibiotic sent to pharmacy and patient notified of new prescription.   New nurse visit appt given.

## 2020-02-10 DIAGNOSIS — M47814 Spondylosis without myelopathy or radiculopathy, thoracic region: Secondary | ICD-10-CM | POA: Diagnosis not present

## 2020-02-10 DIAGNOSIS — M546 Pain in thoracic spine: Secondary | ICD-10-CM | POA: Diagnosis not present

## 2020-02-10 DIAGNOSIS — M9902 Segmental and somatic dysfunction of thoracic region: Secondary | ICD-10-CM | POA: Diagnosis not present

## 2020-02-11 DIAGNOSIS — S299XXA Unspecified injury of thorax, initial encounter: Secondary | ICD-10-CM | POA: Diagnosis not present

## 2020-02-11 DIAGNOSIS — Z8551 Personal history of malignant neoplasm of bladder: Secondary | ICD-10-CM | POA: Diagnosis not present

## 2020-02-11 DIAGNOSIS — R079 Chest pain, unspecified: Secondary | ICD-10-CM | POA: Diagnosis not present

## 2020-02-11 DIAGNOSIS — I10 Essential (primary) hypertension: Secondary | ICD-10-CM | POA: Diagnosis not present

## 2020-02-14 ENCOUNTER — Other Ambulatory Visit: Payer: Self-pay

## 2020-02-14 DIAGNOSIS — I10 Essential (primary) hypertension: Secondary | ICD-10-CM | POA: Diagnosis not present

## 2020-02-14 DIAGNOSIS — J449 Chronic obstructive pulmonary disease, unspecified: Secondary | ICD-10-CM | POA: Diagnosis not present

## 2020-02-14 DIAGNOSIS — C3490 Malignant neoplasm of unspecified part of unspecified bronchus or lung: Secondary | ICD-10-CM | POA: Diagnosis not present

## 2020-02-14 DIAGNOSIS — M9902 Segmental and somatic dysfunction of thoracic region: Secondary | ICD-10-CM | POA: Diagnosis not present

## 2020-02-14 DIAGNOSIS — Z299 Encounter for prophylactic measures, unspecified: Secondary | ICD-10-CM | POA: Diagnosis not present

## 2020-02-14 DIAGNOSIS — M6281 Muscle weakness (generalized): Secondary | ICD-10-CM | POA: Diagnosis not present

## 2020-02-14 DIAGNOSIS — Z87891 Personal history of nicotine dependence: Secondary | ICD-10-CM | POA: Diagnosis not present

## 2020-02-14 DIAGNOSIS — M47814 Spondylosis without myelopathy or radiculopathy, thoracic region: Secondary | ICD-10-CM | POA: Diagnosis not present

## 2020-02-14 DIAGNOSIS — M546 Pain in thoracic spine: Secondary | ICD-10-CM | POA: Diagnosis not present

## 2020-02-14 DIAGNOSIS — N390 Urinary tract infection, site not specified: Secondary | ICD-10-CM

## 2020-02-14 DIAGNOSIS — C7951 Secondary malignant neoplasm of bone: Secondary | ICD-10-CM | POA: Diagnosis not present

## 2020-02-14 LAB — URINE CULTURE

## 2020-02-14 MED ORDER — CEFUROXIME AXETIL 500 MG PO TABS: 500.0000 mg | ORAL_TABLET | Freq: Two times a day (BID) | ORAL | 0 refills | Status: AC

## 2020-02-14 NOTE — Progress Notes (Signed)
New antibiotic sent in due to old one being resistant. BCG appt moved to next week. Patient called at all numbers available and unable to reach. Message left to return call to office.

## 2020-02-15 ENCOUNTER — Telehealth: Payer: Self-pay

## 2020-02-15 LAB — MICROSCOPIC EXAMINATION
Epithelial Cells (non renal): NONE SEEN /hpf (ref 0–10)
WBC, UA: NONE SEEN /hpf (ref 0–5)

## 2020-02-15 LAB — URINALYSIS, ROUTINE W REFLEX MICROSCOPIC
Bilirubin, UA: NEGATIVE
Glucose, UA: NEGATIVE
Ketones, UA: NEGATIVE
Leukocytes,UA: NEGATIVE
Nitrite, UA: NEGATIVE
Specific Gravity, UA: >1.03 — ABNORMAL HIGH (ref 1.005–1.030)
Urobilinogen, Ur: 0.2 mg/dL (ref 0.2–1.0)
pH, UA: 5.5 (ref 5.0–7.5)

## 2020-02-15 NOTE — Telephone Encounter (Signed)
Patient returned call this morning and made aware of new antibiotic sent in.

## 2020-02-16 ENCOUNTER — Ambulatory Visit: Payer: Medicare Other

## 2020-02-16 ENCOUNTER — Other Ambulatory Visit: Payer: Self-pay

## 2020-02-16 DIAGNOSIS — N390 Urinary tract infection, site not specified: Secondary | ICD-10-CM

## 2020-02-16 MED ORDER — AMPICILLIN 500 MG PO CAPS: 500.0000 mg | ORAL_CAPSULE | Freq: Four times a day (QID) | ORAL | 0 refills | Status: AC

## 2020-02-17 DIAGNOSIS — G893 Neoplasm related pain (acute) (chronic): Secondary | ICD-10-CM | POA: Diagnosis not present

## 2020-02-17 DIAGNOSIS — R54 Age-related physical debility: Secondary | ICD-10-CM | POA: Diagnosis not present

## 2020-02-17 DIAGNOSIS — J449 Chronic obstructive pulmonary disease, unspecified: Secondary | ICD-10-CM | POA: Diagnosis not present

## 2020-02-17 DIAGNOSIS — C3411 Malignant neoplasm of upper lobe, right bronchus or lung: Secondary | ICD-10-CM | POA: Diagnosis not present

## 2020-02-17 DIAGNOSIS — J9611 Chronic respiratory failure with hypoxia: Secondary | ICD-10-CM | POA: Diagnosis not present

## 2020-02-18 ENCOUNTER — Telehealth: Payer: Self-pay

## 2020-02-18 DIAGNOSIS — G893 Neoplasm related pain (acute) (chronic): Secondary | ICD-10-CM | POA: Diagnosis not present

## 2020-02-18 DIAGNOSIS — C3411 Malignant neoplasm of upper lobe, right bronchus or lung: Secondary | ICD-10-CM | POA: Diagnosis not present

## 2020-02-18 DIAGNOSIS — J9611 Chronic respiratory failure with hypoxia: Secondary | ICD-10-CM | POA: Diagnosis not present

## 2020-02-18 NOTE — Telephone Encounter (Signed)
Left message that pt was on right antibiotic. No need to change. Any questions to call back.

## 2020-02-21 ENCOUNTER — Ambulatory Visit: Payer: Medicare Other

## 2020-02-21 ENCOUNTER — Encounter: Payer: Self-pay | Admitting: Urology

## 2020-02-21 DIAGNOSIS — Z8551 Personal history of malignant neoplasm of bladder: Secondary | ICD-10-CM

## 2020-02-21 DIAGNOSIS — C3411 Malignant neoplasm of upper lobe, right bronchus or lung: Secondary | ICD-10-CM | POA: Diagnosis not present

## 2020-02-22 DIAGNOSIS — Z515 Encounter for palliative care: Secondary | ICD-10-CM | POA: Diagnosis not present

## 2020-02-22 DIAGNOSIS — Z87891 Personal history of nicotine dependence: Secondary | ICD-10-CM | POA: Diagnosis not present

## 2020-02-22 DIAGNOSIS — J9621 Acute and chronic respiratory failure with hypoxia: Secondary | ICD-10-CM | POA: Diagnosis not present

## 2020-02-22 DIAGNOSIS — C3411 Malignant neoplasm of upper lobe, right bronchus or lung: Secondary | ICD-10-CM | POA: Diagnosis not present

## 2020-02-22 DIAGNOSIS — I700 Atherosclerosis of aorta: Secondary | ICD-10-CM | POA: Diagnosis not present

## 2020-02-22 DIAGNOSIS — Z9981 Dependence on supplemental oxygen: Secondary | ICD-10-CM | POA: Diagnosis not present

## 2020-02-22 DIAGNOSIS — Z66 Do not resuscitate: Secondary | ICD-10-CM | POA: Diagnosis not present

## 2020-02-22 DIAGNOSIS — R069 Unspecified abnormalities of breathing: Secondary | ICD-10-CM | POA: Diagnosis not present

## 2020-02-22 DIAGNOSIS — Z7951 Long term (current) use of inhaled steroids: Secondary | ICD-10-CM | POA: Diagnosis not present

## 2020-02-22 DIAGNOSIS — I714 Abdominal aortic aneurysm, without rupture: Secondary | ICD-10-CM | POA: Diagnosis not present

## 2020-02-22 DIAGNOSIS — R404 Transient alteration of awareness: Secondary | ICD-10-CM | POA: Diagnosis not present

## 2020-02-22 DIAGNOSIS — J440 Chronic obstructive pulmonary disease with acute lower respiratory infection: Secondary | ICD-10-CM | POA: Diagnosis not present

## 2020-02-22 DIAGNOSIS — Z20822 Contact with and (suspected) exposure to covid-19: Secondary | ICD-10-CM | POA: Diagnosis not present

## 2020-02-22 DIAGNOSIS — N179 Acute kidney failure, unspecified: Secondary | ICD-10-CM | POA: Diagnosis not present

## 2020-02-22 DIAGNOSIS — R918 Other nonspecific abnormal finding of lung field: Secondary | ICD-10-CM | POA: Diagnosis not present

## 2020-02-22 DIAGNOSIS — R6889 Other general symptoms and signs: Secondary | ICD-10-CM | POA: Diagnosis not present

## 2020-02-22 DIAGNOSIS — A419 Sepsis, unspecified organism: Secondary | ICD-10-CM | POA: Diagnosis not present

## 2020-02-22 DIAGNOSIS — Z743 Need for continuous supervision: Secondary | ICD-10-CM | POA: Diagnosis not present

## 2020-02-22 DIAGNOSIS — C679 Malignant neoplasm of bladder, unspecified: Secondary | ICD-10-CM | POA: Diagnosis not present

## 2020-02-22 DIAGNOSIS — E875 Hyperkalemia: Secondary | ICD-10-CM | POA: Diagnosis not present

## 2020-02-22 DIAGNOSIS — J9622 Acute and chronic respiratory failure with hypercapnia: Secondary | ICD-10-CM | POA: Diagnosis not present

## 2020-02-22 DIAGNOSIS — I10 Essential (primary) hypertension: Secondary | ICD-10-CM | POA: Diagnosis not present

## 2020-02-22 DIAGNOSIS — R1110 Vomiting, unspecified: Secondary | ICD-10-CM | POA: Diagnosis not present

## 2020-02-22 DIAGNOSIS — R0602 Shortness of breath: Secondary | ICD-10-CM | POA: Diagnosis not present

## 2020-02-22 DIAGNOSIS — Z7401 Bed confinement status: Secondary | ICD-10-CM | POA: Diagnosis not present

## 2020-02-22 DIAGNOSIS — R0902 Hypoxemia: Secondary | ICD-10-CM | POA: Diagnosis not present

## 2020-03-07 DIAGNOSIS — J449 Chronic obstructive pulmonary disease, unspecified: Secondary | ICD-10-CM | POA: Diagnosis not present

## 2020-03-14 DEATH — deceased

## 2020-03-16 ENCOUNTER — Ambulatory Visit: Payer: Medicare Other | Admitting: Urology
